# Patient Record
Sex: Female | Born: 1935 | Race: White | Hispanic: No | Smoking: Never smoker
Health system: Southern US, Community
[De-identification: ages and names within clinical notes are randomized; demographics above are authoritative.]

## PROBLEM LIST (undated history)

## (undated) DIAGNOSIS — N200 Calculus of kidney: Secondary | ICD-10-CM

## (undated) HISTORY — PX: HIP SURGERY: SHX245

## (undated) HISTORY — PX: KNEE SURGERY: SHX244

## (undated) HISTORY — DX: Calculus of kidney: N20.0

---

## 1982-05-31 DIAGNOSIS — Z9884 Bariatric surgery status: Secondary | ICD-10-CM

## 1982-05-31 HISTORY — PX: CHOLECYSTECTOMY: SHX55

## 1982-05-31 HISTORY — PX: STOMACH SURGERY: SHX791

## 1982-05-31 HISTORY — DX: Bariatric surgery status: Z98.84

## 2019-07-10 ENCOUNTER — Other Ambulatory Visit: Payer: Self-pay

## 2019-07-10 ENCOUNTER — Ambulatory Visit (INDEPENDENT_AMBULATORY_CARE_PROVIDER_SITE_OTHER): Payer: Medicare Other | Admitting: Cardiology

## 2019-07-10 ENCOUNTER — Encounter: Payer: Self-pay | Admitting: Cardiology

## 2019-07-10 VITALS — BP 145/89 | HR 77 | Ht 70.0 in | Wt 228.0 lb

## 2019-07-10 DIAGNOSIS — I10 Essential (primary) hypertension: Secondary | ICD-10-CM | POA: Diagnosis not present

## 2019-07-10 DIAGNOSIS — I509 Heart failure, unspecified: Secondary | ICD-10-CM | POA: Insufficient documentation

## 2019-07-10 DIAGNOSIS — E088 Diabetes mellitus due to underlying condition with unspecified complications: Secondary | ICD-10-CM

## 2019-07-10 DIAGNOSIS — E119 Type 2 diabetes mellitus without complications: Secondary | ICD-10-CM | POA: Insufficient documentation

## 2019-07-10 DIAGNOSIS — K911 Postgastric surgery syndromes: Secondary | ICD-10-CM

## 2019-07-10 DIAGNOSIS — E1143 Type 2 diabetes mellitus with diabetic autonomic (poly)neuropathy: Secondary | ICD-10-CM

## 2019-07-10 DIAGNOSIS — I5043 Acute on chronic combined systolic (congestive) and diastolic (congestive) heart failure: Secondary | ICD-10-CM | POA: Diagnosis not present

## 2019-07-10 HISTORY — DX: Diabetes mellitus due to underlying condition with unspecified complications: E08.8

## 2019-07-10 HISTORY — DX: Acute on chronic combined systolic (congestive) and diastolic (congestive) heart failure: I50.43

## 2019-07-10 HISTORY — DX: Postgastric surgery syndromes: K91.1

## 2019-07-10 HISTORY — DX: Type 2 diabetes mellitus with diabetic autonomic (poly)neuropathy: E11.43

## 2019-07-10 HISTORY — DX: Heart failure, unspecified: I50.9

## 2019-07-10 HISTORY — DX: Essential (primary) hypertension: I10

## 2019-07-10 NOTE — Progress Notes (Signed)
Cardiology Office Note:    Date:  07/10/2019   ID:  Dana Brown, DOB May 22, 1935, MRN [id number]  PCP:  Buckner Malta, MD  Cardiologist:  Garwin Brothers, MD   Referring MD: Buckner Malta, MD    ASSESSMENT:    1. Essential hypertension   2. Diabetes mellitus due to underlying condition with unspecified complications (HCC)   3. Acute on chronic combined systolic and diastolic CHF (congestive heart failure) (HCC)    PLAN:    In order of problems listed above:  1. Primary prevention stressed with the patient.  Importance of compliance with diet and medication stressed and she vocalized understanding. 2. History of cardiomyopathy: Patient is doing well with her medications.  Again I would like to get an echocardiogram to assess this.  I am awaiting records from her primary care physician and cardiologist to review them and make appropriate recommendations. 3. Essential hypertension: Blood pressure stable 4. Mixed dyslipidemia and diabetes mellitus: Managed by her primary care physician.  Again she had blood work today and I would like to review them were negative. 5. Patient will be seen in follow-up appointment in 3 months or earlier if the patient has any concerns    Medication Adjustments/Labs and Tests Ordered: Current medicines are reviewed at length with the patient today.  Concerns regarding medicines are outlined above.  No orders of the defined types were placed in this encounter.  No orders of the defined types were placed in this encounter.    History of Present Illness:    Dana Brown is a 85 y.o. female who is being seen today for the evaluation of history of congestive heart failure and to be established at the request of Buckner Malta, MD.  Patient is a pleasant 85 year old female.  She has past medical history of essential hypertension diabetes mellitus.  The patient has history of congestive heart failure according to history provided by her.  She  has moved here from [place].  We are awaiting records from her primary care physician and cardiologist.  She has had surgery for her weight loss in the past.  At the time of my evaluation, the patient is alert awake oriented and in no distress.  Past Medical History:  Diagnosis Date  . Acute on chronic combined systolic and diastolic CHF (congestive heart failure) (HCC) 07/10/2019   Swelling in lower extremeties Mild to Moderate Duration: several years  . Diabetes mellitus with peripheral autonomic neuropathy (HCC) 07/10/2019   Mild to moderate. Diet controlled only.  Duration: several years  . 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 Essential hypertension 07/10/2019  . Nephrolithiasis     Past Surgical History:  Procedure Laterality Date  . CHOLECYSTECTOMY  1984  . HIP SURGERY Right    Replacement  . KNEE SURGERY Left    Replacement  . STOMACH SURGERY  1984   Stapled    Current Medications: Current Meds  Medication Sig  . Ascorbic Acid (VITA-C PO) Take by mouth daily.  Marland Kitchen b complex vitamins tablet Take 1 tablet by mouth daily.  Jeanie Cooks Serrata (BOSWELLIA PO) Take by mouth daily.  . Cholecalciferol (VITAMIN D3 PO) Take by mouth daily.  . Coenzyme Q10 (CO Q 10 PO) Take by mouth daily.  Marland Kitchen ECHINACEA EXTRACT PO Take by mouth daily.  . furosemide (LASIX) 20 MG tablet Take 20 mg by mouth 2 (two)  times daily.  . Ginger, Zingiber officinalis, (GINGER PO) Take by mouth daily.  Marland Kitchen GINKGO BILOBA PO Take by mouth daily.  Marland Kitchen MAGNESIUM PO Take by mouth daily.  Marland Kitchen MILK THISTLE PO Take by mouth daily.  . Misc Natural Products (COLON CARE PO) Take by mouth daily.  . Nutritional Supplements (GRAPESEED EXTRACT PO) Take by mouth daily.  . Omega 3-6-9 Fatty Acids (TRIPLE OMEGA-3-6-9 PO) Take by mouth daily.  . potassium chloride (KLOR-CON) 10 MEQ tablet Take 10 mEq by mouth daily.  .  PSYLLIUM HUSK PO Take by mouth daily.  . sacubitril-valsartan (ENTRESTO) 24-26 MG Take 1 tablet by mouth 2 (two) times daily.  Marland Kitchen telmisartan (MICARDIS) 80 MG tablet Take 80 mg by mouth daily.  . Turmeric (QC TUMERIC COMPLEX PO) Take by mouth daily.  Marland Kitchen VITAMIN E PO Take by mouth daily.     Allergies:   Sulfa antibiotics   Social History   Socioeconomic History  . Marital status: Widowed    Spouse name: Not on file  . Number of children: Not on file  . Years of education: Not on file  . Highest education level: Not on file  Occupational History  . Not on file  Tobacco Use  . Smoking status: Never Smoker  . Smokeless tobacco: Never Used  Substance and Sexual Activity  . Alcohol use: Never  . Drug use: Not on file  . Sexual activity: Not on file  Other Topics Concern  . Not on file  Social History Narrative  . Not on file   Social Determinants of Health   Financial Resource Strain:   . Difficulty of Paying Living Expenses: Not on file  Food Insecurity:   . Worried About Charity fundraiser in the Last Year: Not on file  . Ran Out of Food in the Last Year: Not on file  Transportation Needs:   . Lack of Transportation (Medical): Not on file  . Lack of Transportation (Non-Medical): Not on file  Physical Activity:   . Days of Exercise per Week: Not on file  . Minutes of Exercise per Session: Not on file  Stress:   . Feeling of Stress : Not on file  Social Connections:   . Frequency of Communication with Friends and Family: Not on file  . Frequency of Social Gatherings with Friends and Family: Not on file  . Attends Religious Services: Not on file  . Active Member of Clubs or Organizations: Not on file  . Attends Archivist Meetings: Not on file  . Marital Status: Not on file     Family History: The patient's family history is not on file.  ROS:   Please see the history of present illness.    All other systems reviewed and are negative.  EKGs/Labs/Other  Studies Reviewed:    The following studies were reviewed today: EKG reveals sinus rhythm and nonspecific ST-T changes   Recent Labs: No results found for requested labs within last 8760 hours.  Recent Lipid Panel No results found for: CHOL, TRIG, HDL, CHOLHDL, VLDL, LDLCALC, LDLDIRECT  Physical Exam:    VS:  BP (!) 145/89   Pulse 77   Ht 5\' 10"  (1.778 m)   Wt 228 lb (103.4 kg)   SpO2 94%   BMI 32.71 kg/m     Wt Readings from Last 3 Encounters:  07/10/19 228 lb (103.4 kg)     GEN: Patient is in no acute distress HEENT: Normal NECK: No JVD; No  carotid bruits LYMPHATICS: No lymphadenopathy CARDIAC: S1 S2 regular, 2/6 systolic murmur at the apex. RESPIRATORY:  Clear to auscultation without rales, wheezing or rhonchi  ABDOMEN: Soft, non-tender, non-distended MUSCULOSKELETAL:  No edema; No deformity  SKIN: Warm and dry NEUROLOGIC:  Alert and oriented x 3 PSYCHIATRIC:  Normal affect    Signed, Garwin Brothers, MD  07/10/2019 3:25 PM    [facility]

## 2019-07-10 NOTE — Patient Instructions (Addendum)
Medication Instructions:  Your physician recommends that you continue on your current medications as directed. Please refer to the Current Medication list given to you today.  *If you need a refill on your cardiac medications before your next appointment, please call your pharmacy*  Lab Work: NONE If you have labs (blood work) drawn today and your tests are completely normal, you will receive your results only by: Marland Kitchen MyChart Message (if you have MyChart) OR . A paper copy in the mail If you have any lab test that is abnormal or we need to change your treatment, we will call you to review the results.  Testing/Procedures: Your physician has requested that you have an echocardiogram. Echocardiography is a painless test that uses sound waves to create images of your heart. It provides your doctor with information about the size and shape of your heart and how well your heart's chambers and valves are working. This procedure takes approximately one hour. There are no restrictions for this procedure.  Follow-Up: At [facility], you and your health needs are our priority.  As part of our continuing mission to provide you with exceptional heart care, we have created designated Provider Care Teams.  These Care Teams include your primary Cardiologist (physician) and Advanced Practice Providers (APPs -  Physician Assistants and Nurse Practitioners) who all work together to provide you with the care you need, when you need it.  Your next appointment:   3 month(s)  The format for your next appointment:   In Person  Provider:   Belva Crome, MD  Other Instructions   Echocardiogram An echocardiogram is a procedure that uses painless sound waves (ultrasound) to produce an image of the heart. Images from an echocardiogram can provide important information about:  Signs of coronary artery disease (CAD).  Aneurysm detection. An aneurysm is a weak or damaged part of an artery wall that bulges out  from the normal force of blood pumping through the body.  Heart size and shape. Changes in the size or shape of the heart can be associated with certain conditions, including heart failure, aneurysm, and CAD.  Heart muscle function.  Heart valve function.  Signs of a past heart attack.  Fluid buildup around the heart.  Thickening of the heart muscle.  A tumor or infectious growth around the heart valves. Tell a health care provider about:  Any allergies you have.  All medicines you are taking, including vitamins, herbs, eye drops, creams, and over-the-counter medicines.  Any blood disorders you have.  Any surgeries you have had.  Any medical conditions you have.  Whether you are pregnant or may be pregnant. What are the risks? Generally, this is a safe procedure. However, problems may occur, including:  Allergic reaction to dye (contrast) that may be used during the procedure. What happens before the procedure? No specific preparation is needed. You may eat and drink normally. What happens during the procedure?   An IV tube may be inserted into one of your veins.  You may receive contrast through this tube. A contrast is an injection that improves the quality of the pictures from your heart.  A gel will be applied to your chest.  A wand-like tool (transducer) will be moved over your chest. The gel will help to transmit the sound waves from the transducer.  The sound waves will harmlessly bounce off of your heart to allow the heart images to be captured in real-time motion. The images will be recorded on a computer.  The procedure may vary among health care providers and hospitals. What happens after the procedure?  You may return to your normal, everyday life, including diet, activities, and medicines, unless your health care provider tells you not to do that. Summary  An echocardiogram is a procedure that uses painless sound waves (ultrasound) to produce an image  of the heart.  Images from an echocardiogram can provide important information about the size and shape of your heart, heart muscle function, heart valve function, and fluid buildup around your heart.  You do not need to do anything to prepare before this procedure. You may eat and drink normally.  After the echocardiogram is completed, you may return to your normal, everyday life, unless your health care provider tells you not to do that. This information is not intended to replace advice given to you by your health care provider. Make sure you discuss any questions you have with your health care provider. Document Revised: 09/07/2018 Document Reviewed: 06/19/2016 Elsevier Patient Education  [place].

## 2019-07-16 ENCOUNTER — Ambulatory Visit: Payer: Self-pay | Admitting: Podiatry

## 2019-07-23 ENCOUNTER — Other Ambulatory Visit: Payer: Self-pay

## 2019-07-23 ENCOUNTER — Ambulatory Visit (INDEPENDENT_AMBULATORY_CARE_PROVIDER_SITE_OTHER): Payer: Medicare Other | Admitting: Podiatry

## 2019-07-23 DIAGNOSIS — B351 Tinea unguium: Secondary | ICD-10-CM

## 2019-07-23 DIAGNOSIS — E1142 Type 2 diabetes mellitus with diabetic polyneuropathy: Secondary | ICD-10-CM | POA: Diagnosis not present

## 2019-07-23 DIAGNOSIS — E1169 Type 2 diabetes mellitus with other specified complication: Secondary | ICD-10-CM

## 2019-07-23 DIAGNOSIS — E119 Type 2 diabetes mellitus without complications: Secondary | ICD-10-CM | POA: Diagnosis not present

## 2019-07-23 NOTE — Progress Notes (Signed)
  Subjective:  Patient ID: Dana Brown, female    DOB: 1935/05/17,  MRN: [id number]  Chief Complaint  Patient presents with  . debride    diabetiv nail trimming  . Diabetes    FBS: 107 x 1 wk A1C: unkwon PCP: pt does not remember name of Dr.    85 y.o. female presents with the above complaint. History confirmed with patient.   Objective:  Physical Exam: warm, good capillary refill, nail exam onychomycosis of the toenails, no trophic changes or ulcerative lesions. DP pulses palpable, PT pulses palpable, epicritic sensation to light touch intact and protective sensation absent Left Foot: normal exam, no swelling, tenderness, instability; ligaments intact, full range of motion of all ankle/foot joints  Right Foot: normal exam, no swelling, tenderness, instability; ligaments intact, full range of motion of all ankle/foot joints   No images are attached to the encounter.  Assessment:   1. Onychomycosis of multiple toenails with type 2 diabetes mellitus and peripheral neuropathy (HCC)   2. Encounter for diabetic foot exam [facility])    Plan:  Patient was evaluated and treated and all questions answered.  Onychomycosis, Diabetes and DPN -Patient is diabetic with a qualifying condition for at risk foot care. -Educated on DM Footcare.  Procedure: Nail Debridement Rationale: Patient meets criteria for routine foot care due to DPN Type of Debridement: manual, sharp debridement. Instrumentation: Nail nipper, rotary burr. Number of Nails: 10

## 2019-08-28 ENCOUNTER — Ambulatory Visit (INDEPENDENT_AMBULATORY_CARE_PROVIDER_SITE_OTHER): Payer: Medicare Other

## 2019-08-28 ENCOUNTER — Other Ambulatory Visit: Payer: Self-pay

## 2019-08-28 DIAGNOSIS — I5043 Acute on chronic combined systolic (congestive) and diastolic (congestive) heart failure: Secondary | ICD-10-CM

## 2019-08-28 DIAGNOSIS — I77819 Aortic ectasia, unspecified site: Secondary | ICD-10-CM

## 2019-08-28 DIAGNOSIS — E785 Hyperlipidemia, unspecified: Secondary | ICD-10-CM

## 2019-08-28 DIAGNOSIS — I2584 Coronary atherosclerosis due to calcified coronary lesion: Secondary | ICD-10-CM

## 2019-08-28 DIAGNOSIS — I10 Essential (primary) hypertension: Secondary | ICD-10-CM | POA: Diagnosis not present

## 2019-08-28 DIAGNOSIS — I2510 Atherosclerotic heart disease of native coronary artery without angina pectoris: Secondary | ICD-10-CM

## 2019-08-28 NOTE — Progress Notes (Signed)
Complete echocardiogram has been performed.  Jimmy Inetha Maret RDCS, RVT 

## 2019-09-06 ENCOUNTER — Other Ambulatory Visit: Payer: Self-pay

## 2019-09-06 DIAGNOSIS — I10 Essential (primary) hypertension: Secondary | ICD-10-CM

## 2019-09-08 LAB — BASIC METABOLIC PANEL
BUN/Creatinine Ratio: 22 (ref 12–28)
BUN: 21 mg/dL (ref 8–27)
CO2: 24 mmol/L (ref 20–29)
Calcium: 9.8 mg/dL (ref 8.7–10.3)
Chloride: 105 mmol/L (ref 96–106)
Creatinine, Ser: 0.94 mg/dL (ref 0.57–1.00)
GFR calc Af Amer: 65 mL/min/{1.73_m2} (ref >59)
GFR calc non Af Amer: 56 mL/min/{1.73_m2} — ABNORMAL LOW (ref >59)
Glucose: 113 mg/dL — ABNORMAL HIGH (ref 65–99)
Potassium: 4.2 mmol/L (ref 3.5–5.2)
Sodium: 143 mmol/L (ref 134–144)

## 2019-09-12 ENCOUNTER — Other Ambulatory Visit: Payer: Self-pay

## 2019-09-12 ENCOUNTER — Ambulatory Visit (INDEPENDENT_AMBULATORY_CARE_PROVIDER_SITE_OTHER)
Admission: RE | Admit: 2019-09-12 | Discharge: 2019-09-12 | Disposition: A | Discharge status: Home / Self Care | Payer: Medicare Other | Source: Ambulatory Visit | Attending: Cardiology | Admitting: Cardiology

## 2019-09-12 DIAGNOSIS — I77819 Aortic ectasia, unspecified site: Secondary | ICD-10-CM | POA: Diagnosis not present

## 2019-09-12 MED ORDER — IOHEXOL 350 MG/ML SOLN
100.0000 mL | Freq: Once | INTRAVENOUS | Status: AC | PRN
  Administered 2019-09-12: 100 mL via INTRAVENOUS

## 2019-09-18 LAB — LIPID PANEL
Chol/HDL Ratio: 3.5 ratio (ref 0.0–4.4)
Cholesterol, Total: 207 mg/dL — ABNORMAL HIGH (ref 100–199)
HDL: 59 mg/dL (ref >39)
LDL Chol Calc (NIH): 127 mg/dL — ABNORMAL HIGH (ref 0–99)
Triglycerides: 116 mg/dL (ref 0–149)
VLDL Cholesterol Cal: 21 mg/dL (ref 5–40)

## 2019-09-18 LAB — HEPATIC FUNCTION PANEL
ALT: 22 IU/L (ref 0–32)
AST: 27 IU/L (ref 0–40)
Albumin: 4.3 g/dL (ref 3.6–4.6)
Alkaline Phosphatase: 57 IU/L (ref 39–117)
Bilirubin Total: 0.7 mg/dL (ref 0.0–1.2)
Bilirubin, Direct: 0.17 mg/dL (ref 0.00–0.40)
Total Protein: 7.1 g/dL (ref 6.0–8.5)

## 2019-09-20 MED ORDER — ATORVASTATIN CALCIUM 10 MG PO TABS: 10.0000 mg | ORAL_TABLET | Freq: Every day | ORAL | 6 refills | Status: DC

## 2019-10-09 ENCOUNTER — Encounter: Payer: Self-pay | Admitting: Cardiology

## 2019-10-09 ENCOUNTER — Ambulatory Visit (INDEPENDENT_AMBULATORY_CARE_PROVIDER_SITE_OTHER): Payer: Medicare Other | Admitting: Cardiology

## 2019-10-09 ENCOUNTER — Other Ambulatory Visit: Payer: Self-pay

## 2019-10-09 VITALS — BP 150/90 | HR 64 | Ht 70.0 in | Wt 235.0 lb

## 2019-10-09 DIAGNOSIS — I77810 Thoracic aortic ectasia: Secondary | ICD-10-CM | POA: Diagnosis not present

## 2019-10-09 DIAGNOSIS — I5043 Acute on chronic combined systolic (congestive) and diastolic (congestive) heart failure: Secondary | ICD-10-CM | POA: Diagnosis not present

## 2019-10-09 DIAGNOSIS — I10 Essential (primary) hypertension: Secondary | ICD-10-CM | POA: Diagnosis not present

## 2019-10-09 DIAGNOSIS — E088 Diabetes mellitus due to underlying condition with unspecified complications: Secondary | ICD-10-CM

## 2019-10-09 NOTE — Progress Notes (Signed)
Cardiology Office Note:    Date:  10/09/2019   ID:  Dana Brown, DOB Jul 20, 1934, MRN [id number]  PCP:  Buckner Malta, MD  Cardiologist:  Garwin Brothers, MD   Referring MD: Buckner Malta, MD    ASSESSMENT:    1. Acute on chronic combined systolic and diastolic CHF (congestive heart failure) (HCC)   2. Essential hypertension   3. Diabetes mellitus due to underlying condition with unspecified complications (HCC)   4. Ascending aorta dilatation (HCC)    PLAN:    In order of problems listed above:  1. I discussed my findings with the patient at extensive length.  Echocardiogram and CT scan reports were discussed. 2. Coronary calcification: Secondary prevention stressed with patient.  Importance of compliance with diet and medication stressed and he vocalized understanding.  We have initiated her on lipid-lowering medications and she will be back in 6 weeks for liver lipid check.  Importance of regular exercise stressed weight reduction was stressed 3. Essential hypertension: Blood pressure stable she has missed her diuretic today so blood pressure is elevated.  She promises to be compliant. 4. Mixed dyslipidemia: Diet was emphasized and medications to she is very compliant with taking her statin regularly and see me back in 6 weeks for liver lipid check as mentioned above. 5. Obesity: Weight reduction was stressed and secondary prevention stressed Ascending aortic dilatation: I educated her about this.  She will need annual CT scan per recommendations.Patient will be seen in follow-up appointment in 6 months or earlier if the patient has any concerns    Medication Adjustments/Labs and Tests Ordered: Current medicines are reviewed at length with the patient today.  Concerns regarding medicines are outlined above.  No orders of the defined types were placed in this encounter.  No orders of the defined types were placed in this encounter.    Chief Complaint  Patient  presents with  . Follow-up     History of Present Illness:    Dana Brown is a 85 y.o. female.  Patient was evaluated by me for history of congestive heart failure, diabetes mellitus and dyslipidemia.  She has coronary atherosclerosis and calcification noted on CT scan.  She denies any problems at this time and takes care of activities of daily living.  No chest pain orthopnea or PND.  At the time of my evaluation, the patient is alert awake oriented and in no distress.  Past Medical History:  Diagnosis Date  . Acute on chronic combined systolic and diastolic CHF (congestive heart failure) (HCC) 07/10/2019   Swelling in lower extremeties Mild to Moderate Duration: several years  . Congestive heart failure (CHF) (HCC) 07/10/2019  . Diabetes mellitus due to underlying condition with unspecified complications (HCC) 07/10/2019  . Diabetes mellitus with peripheral autonomic neuropathy (HCC) 07/10/2019   Mild to moderate. Diet controlled only.  Duration: several years  . 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 Essential hypertension 07/10/2019  . Nephrolithiasis     Past Surgical History:  Procedure Laterality Date  . CHOLECYSTECTOMY  1984  . HIP SURGERY Right    Replacement  . KNEE SURGERY Left    Replacement  . STOMACH SURGERY  1984   Stapled    Current Medications: Current Meds  Medication Sig  . Ascorbic Acid (VITA-C PO) Take by mouth daily.  Marland Kitchen atorvastatin (LIPITOR) 10 MG tablet Take 1 tablet (10 mg  total) by mouth daily.  Marland Kitchen b complex vitamins tablet Take 1 tablet by mouth daily.  Azucena Freed Serrata (BOSWELLIA PO) Take by mouth daily.  . Cholecalciferol (VITAMIN D3 PO) Take by mouth daily.  . Coenzyme Q10 (CO Q 10 PO) Take by mouth daily.  Marland Kitchen ECHINACEA EXTRACT PO Take by mouth daily.  . furosemide (LASIX) 20 MG tablet Take 20 mg by mouth 2 (two) times daily.  .  Ginger, Zingiber officinalis, (GINGER PO) Take by mouth daily.  Marland Kitchen GINKGO BILOBA PO Take by mouth daily.  Marland Kitchen MAGNESIUM PO Take by mouth daily.  Marland Kitchen MILK THISTLE PO Take by mouth daily.  . Misc Natural Products (COLON CARE PO) Take by mouth daily.  . Nutritional Supplements (GRAPESEED EXTRACT PO) Take by mouth daily.  . Omega 3-6-9 Fatty Acids (TRIPLE OMEGA-3-6-9 PO) Take by mouth daily.  . potassium chloride (KLOR-CON) 10 MEQ tablet Take 10 mEq by mouth daily.  . PSYLLIUM HUSK PO Take by mouth daily.  Marland Kitchen telmisartan (MICARDIS) 80 MG tablet Take 80 mg by mouth daily.  . Turmeric (QC TUMERIC COMPLEX PO) Take by mouth daily.  Marland Kitchen VITAMIN E PO Take by mouth daily.     Allergies:   Sulfa antibiotics   Social History   Socioeconomic History  . Marital status: Widowed    Spouse name: Not on file  . Number of children: Not on file  . Years of education: Not on file  . Highest education level: Not on file  Occupational History  . Not on file  Tobacco Use  . Smoking status: Never Smoker  . Smokeless tobacco: Never Used  Substance and Sexual Activity  . Alcohol use: Never  . Drug use: Not on file  . Sexual activity: Not on file  Other Topics Concern  . Not on file  Social History Narrative  . Not on file   Social Determinants of Health   Financial Resource Strain:   . Difficulty of Paying Living Expenses:   Food Insecurity:   . Worried About Charity fundraiser in the Last Year:   . Arboriculturist in the Last Year:   Transportation Needs:   . Film/video editor (Medical):   Marland Kitchen Lack of Transportation (Non-Medical):   Physical Activity:   . Days of Exercise per Week:   . Minutes of Exercise per Session:   Stress:   . Feeling of Stress :   Social Connections:   . Frequency of Communication with Friends and Family:   . Frequency of Social Gatherings with Friends and Family:   . Attends Religious Services:   . Active Member of Clubs or Organizations:   . Attends Theatre manager Meetings:   Marland Kitchen Marital Status:      Family History: The patient's family history is not on file.  ROS:   Please see the history of present illness.    All other systems reviewed and are negative.  EKGs/Labs/Other Studies Reviewed:    The following studies were reviewed today: IMPRESSION: Dilatation of the ascending aorta with maximal transverse diameter of 4.0 cm no dissection. Recommend annual imaging followup by CTA or MRA. This recommendation follows 2010 ACCF/AHA/AATS/ACR/ASA/SCA/SCAI/SIR/STS/SVM Guidelines for the Diagnosis and Management of Patients with Thoracic Aortic Disease. Circulation. 2010; 121: U202-R427. Aortic aneurysm NOS (ICD10-I71.9)  Coronary artery calcification. Relatively mild scattered thoracic atherosclerotic calcification.   Electronically Signed   By: Nelson Chimes M.D.   On: 09/12/2019 16:19   IMPRESSIONS  1. Left ventricular ejection fraction, by estimation, is 60 to 65%. The  left ventricle has normal function. The left ventricle has no regional  wall motion abnormalities. There is mild left ventricular hypertrophy.  Left ventricular diastolic parameters  are consistent with Grade I diastolic dysfunction (impaired relaxation).  2. Right ventricular systolic function is normal. The right ventricular  size is normal. There is normal pulmonary artery systolic pressure.  3. Left atrial size was mildly dilated.  4. The mitral valve is normal in structure. No evidence of mitral valve  regurgitation. No evidence of mitral stenosis.  5. The aortic valve is normal in structure. Aortic valve regurgitation is  not visualized. Mild aortic valve stenosis.  6. There is moderate dilatation of the ascending aorta measuring 43 mm.  7. The inferior vena cava is normal in size with greater than 50%  respiratory variability, suggesting right atrial pressure of 3 mmHg.     Recent Labs: 09/07/2019: BUN 21; Creatinine, Ser 0.94;  Potassium 4.2; Sodium 143 09/17/2019: ALT 22  Recent Lipid Panel    Component Value Date/Time   CHOL 207 (H) 09/17/2019 1201   TRIG 116 09/17/2019 1201   HDL 59 09/17/2019 1201   CHOLHDL 3.5 09/17/2019 1201   LDLCALC 127 (H) 09/17/2019 1201    Physical Exam:    VS:  BP (!) 150/90   Pulse 64   Ht 5\' 10"  (1.778 m)   Wt 235 lb (106.6 kg)   SpO2 99%   BMI 33.72 kg/m     Wt Readings from Last 3 Encounters:  10/09/19 235 lb (106.6 kg)  07/10/19 228 lb (103.4 kg)     GEN: Patient is in no acute distress HEENT: Normal NECK: No JVD; No carotid bruits LYMPHATICS: No lymphadenopathy CARDIAC: Hear sounds regular, 2/6 systolic murmur at the apex. RESPIRATORY:  Clear to auscultation without rales, wheezing or rhonchi  ABDOMEN: Soft, non-tender, non-distended MUSCULOSKELETAL:  No edema; No deformity  SKIN: Warm and dry NEUROLOGIC:  Alert and oriented x 3 PSYCHIATRIC:  Normal affect   Signed, 09/07/19, MD  10/09/2019 3:45 PM    [facility]

## 2019-10-09 NOTE — Patient Instructions (Signed)
Medication Instructions:  No medication changes *If you need a refill on your cardiac medications before your next appointment, please call your pharmacy*   Lab Work: Your physician recommends that you return for lab work in: next 6 weeks.  You need to have labs done when you are fasting.  You can come Monday through Friday 8:30 am to 12:00 pm and 1:15 to 4:30. You do not need to make an appointment as the order has already been placed. The labs you are going to have done are  LFT and Lipids.   If you have labs (blood work) drawn today and your tests are completely normal, you will receive your results only by: Marland Kitchen MyChart Message (if you have MyChart) OR . A paper copy in the mail If you have any lab test that is abnormal or we need to change your treatment, we will call you to review the results.   Testing/Procedures: None ordered   Follow-Up: At [facility], you and your health needs are our priority.  As part of our continuing mission to provide you with exceptional heart care, we have created designated Provider Care Teams.  These Care Teams include your primary Cardiologist (physician) and Advanced Practice Providers (APPs -  Physician Assistants and Nurse Practitioners) who all work together to provide you with the care you need, when you need it.  We recommend signing up for the patient portal called "MyChart".  Sign up information is provided on this After Visit Summary.  MyChart is used to connect with patients for Virtual Visits (Telemedicine).  Patients are able to view lab/test results, encounter notes, upcoming appointments, etc.  Non-urgent messages can be sent to your provider as well.   To learn more about what you can do with MyChart, go to [email or web address].    Your next appointment:   3 month(s)  The format for your next appointment:   In Person  Provider:   Belva Crome, MD   Other Instructions NA

## 2019-10-23 ENCOUNTER — Ambulatory Visit: Payer: Medicare Other | Admitting: Podiatry

## 2020-01-09 ENCOUNTER — Encounter: Payer: Self-pay | Admitting: Cardiology

## 2020-01-09 ENCOUNTER — Other Ambulatory Visit: Payer: Self-pay

## 2020-01-09 ENCOUNTER — Ambulatory Visit (INDEPENDENT_AMBULATORY_CARE_PROVIDER_SITE_OTHER): Payer: Medicare Other | Admitting: Cardiology

## 2020-01-09 VITALS — BP 156/104 | HR 85 | Ht 69.0 in | Wt 237.6 lb

## 2020-01-09 DIAGNOSIS — I2510 Atherosclerotic heart disease of native coronary artery without angina pectoris: Secondary | ICD-10-CM | POA: Diagnosis not present

## 2020-01-09 DIAGNOSIS — I77810 Thoracic aortic ectasia: Secondary | ICD-10-CM | POA: Diagnosis not present

## 2020-01-09 DIAGNOSIS — E088 Diabetes mellitus due to underlying condition with unspecified complications: Secondary | ICD-10-CM

## 2020-01-09 DIAGNOSIS — I10 Essential (primary) hypertension: Secondary | ICD-10-CM | POA: Diagnosis not present

## 2020-01-09 MED ORDER — ATORVASTATIN CALCIUM 10 MG PO TABS
10.0000 mg | ORAL_TABLET | Freq: Every day | ORAL | 3 refills | Status: DC
Start: 2020-01-09 — End: 2020-07-08

## 2020-01-09 MED ORDER — BLOOD PRESSURE MONITOR AUTOMAT DEVI: 1.0000 | Freq: Two times a day (BID) | 0 refills | Status: AC

## 2020-01-09 NOTE — Patient Instructions (Addendum)
Medication Instructions:  Your physician has recommended you make the following change in your medication:   Start atorvastatin 10 mg daily. ' *If you need a refill on your cardiac medications before your next appointment, please call your pharmacy*   Lab Work: Your physician recommends that you have a LFT's done today. Your physician recommends that you return for lab work in: 6 weeks (02/25/20) You need to have labs done when you are fasting.  You can come Monday through Friday 8:30 am to 12:00 pm and 1:15 to 4:30. You do not need to make an appointment as the order has already been placed. The labs you are going to have done are  LFT and Lipids.   If you have labs (blood work) drawn today and your tests are completely normal, you will receive your results only by: Marland Kitchen MyChart Message (if you have MyChart) OR . A paper copy in the mail If you have any lab test that is abnormal or we need to change your treatment, we will call you to review the results.   Testing/Procedures: None ordered   Follow-Up: At [facility], you and your health needs are our priority.  As part of our continuing mission to provide you with exceptional heart care, we have created designated Provider Care Teams.  These Care Teams include your primary Cardiologist (physician) and Advanced Practice Providers (APPs -  Physician Assistants and Nurse Practitioners) who all work together to provide you with the care you need, when you need it.  We recommend signing up for the patient portal called "MyChart".  Sign up information is provided on this After Visit Summary.  MyChart is used to connect with patients for Virtual Visits (Telemedicine).  Patients are able to view lab/test results, encounter notes, upcoming appointments, etc.  Non-urgent messages can be sent to your provider as well.   To learn more about what you can do with MyChart, go to [email or web address].    Your next appointment:   6 month(s)  The  format for your next appointment:   In Person  Provider:   Belva Crome, MD   Other Instructions Atorvastatin; Ezetimibe oral tablets What is this medicine? ATORVASTATIN; EZETIMIBE (a TORE va sta tin; ez ET i mibe) blocks the body's ability to absorb and make cholesterol. It is used to lower cholesterol. It is only for patients whose cholesterol level is not controlled by diet. This medicine may be used for other purposes; ask your health care provider or pharmacist if you have questions. COMMON BRAND NAME(S): Liptruzet What should I tell my health care provider before I take this medicine? They need to know if you have any of these conditions:  diabetes  history of stroke  if you often drink alcohol  kidney disease  liver disease  muscle aches or weakness  thyroid disease  an unusual or allergic reaction to atorvastatin, ezetimibe, other medicines, foods, dyes, or preservatives  pregnant or trying to get pregnant  breast-feeding How should I use this medicine? Take this medicine by mouth with a glass of water. Follow the directions on the prescription label. Do not cut, crush or chew this medicine. You can take it with or without food. If it upsets your stomach, take it with food. Take your medicine at regular intervals. Do not take it more often than directed. Do not stop taking except on your doctor's advice. Talk to your pediatrician regarding the use of this medicine in children. Special care may be needed. Overdosage: If  you think you have taken too much of this medicine contact a poison control center or emergency room at once. NOTE: This medicine is only for you. Do not share this medicine with others. What if I miss a dose? If you miss a dose, take it as soon as you can. If it is almost time for your next dose, take only that dose. Do not take double or extra doses. What may interact with this medicine? Do not take this medicine with any of the following  medications:  posaconazole  red yeast rice  telithromycin  voriconazole This medicine may also interact with the following medications:  alcohol  antiviral medicines for HIV or AIDS  boceprevir  certain antibiotics like erythromycin and clarithromycin  certain medicines for cholesterol like fenofibrate or gemfibrozil  cimetidine  colchicine  cyclosporine  digoxin  female hormones, like estrogens or progestins and birth control pills  grapefruit juice  medicines for fungal infections like fluconazole, itraconazole, ketoconazole  niacin  rifampin  spironolactone  telaprevir  warfarin This list may not describe all possible interactions. Give your health care provider a list of all the medicines, herbs, non-prescription drugs, or dietary supplements you use. Also tell them if you smoke, drink alcohol, or use illegal drugs. Some items may interact with your medicine. What should I watch for while using this medicine? Visit your doctor or health care professional for regular check-ups. You may need regular tests to make sure your liver is working properly. Tell your doctor or health care professional right away if you get any unexplained muscle pain, tenderness, or weakness, especially if you also have a fever and tiredness. Your doctor or health care professional may tell you to stop taking this medicine if you develop muscle problems. If your muscle problems do not go away after stopping this medicine, contact your health care professional. This medicine may increase blood sugar. Ask your healthcare provider if changes in diet or medicines are needed if you have diabetes. This drug is only part of a total heart-health program. Your doctor or a dietician can suggest a low-cholesterol and low-fat diet to help. Avoid alcohol and smoking, and keep a proper exercise schedule. Do not become pregnant while taking this medicine. Women should inform their doctor if they wish to  become pregnant or think they might be pregnant. There is a potential for serious side effects to an unborn child. Talk to your health care professional or pharmacist for more information. Do not breast-feed an infant while taking this medicine. This medicine may cause a decrease in Co-Enzyme Q-10. You should make sure that you get enough Co-Enzyme Q-10 while you are taking this medicine. Discuss the foods you eat and the vitamins you take with your health care professional. What side effects may I notice from receiving this medicine? Side effects that you should report to your doctor or health care professional as soon as possible:  allergic reactions like skin rash, itching or hives, swelling of the face, lips, or tongue  confusion  dark urine  general ill feeling or flu-like symptoms  light-colored stools  loss of appetite, nausea  loss of memory  muscle pain  redness, blistering, peeling or loosening of the skin, including inside the mouth  right upper belly pain  signs and symptoms of high blood sugar such as being more thirsty or hungry or having to urinate more than normal. You may also feel very tired or have blurry vision.  trouble passing urine  unusually weak  yellowing of the eyes or skin Side effects that usually do not require medical attention (report to your doctor or health care professional if they continue or are bothersome):  cough  diarrhea  dizziness  joint pain This list may not describe all possible side effects. Call your doctor for medical advice about side effects. You may report side effects to FDA at 1-800-FDA-1088. Where should I keep my medicine? Keep out of the reach of children. Store at room temperature between 15 and 30 degrees C (59 and 86 degrees F). Store in the foil pouch until use. After the foil pouch is opened, protect this medicine from moisture and light. Once a tablet is removed, slide blister card back into case. Store the case  in a dry place, and throw away any unused tablets 30 days after the pouch is opened. NOTE: This sheet is a summary. It may not cover all possible information. If you have questions about this medicine, talk to your doctor, pharmacist, or health care provider.  2020 Elsevier/Gold Standard (2018-03-08 11:39:19)   Blood Pressure Record Sheet To take your blood pressure, you will need a blood pressure machine. You can buy a blood pressure machine (blood pressure monitor) at your clinic, drug store, or online. When choosing one, consider:  An automatic monitor that has an arm cuff.  A cuff that wraps snugly around your upper arm. You should be able to fit only one finger between your arm and the cuff.  A device that stores blood pressure reading results.  Do not choose a monitor that measures your blood pressure from your wrist or finger. Follow your health care provider's instructions for how to take your blood pressure. To use this form:  Get one reading in the morning (a.m.) 1-2 hours after you take any medicines.  Get one reading in the evening (p.m.) before supper.  Take at least 2 readings with each blood pressure check. This makes sure the results are correct. Wait 1-2 minutes between measurements.  Write down the results in the spaces on this form.  Repeat this once a week, or as told by your health care provider.  Make a follow-up appointment with your health care provider to discuss the results. Blood pressure log Date: _______________________  a.m. _____________________(1st reading) _____________________(2nd reading)  p.m. _____________________(1st reading) _____________________(2nd reading) Date: _______________________  a.m. _____________________(1st reading) _____________________(2nd reading)  p.m. _____________________(1st reading) _____________________(2nd reading) Date: _______________________  a.m. _____________________(1st reading) _____________________(2nd  reading)  p.m. _____________________(1st reading) _____________________(2nd reading) Date: _______________________  a.m. _____________________(1st reading) _____________________(2nd reading)  p.m. _____________________(1st reading) _____________________(2nd reading) Date: _______________________  a.m. _____________________(1st reading) _____________________(2nd reading)  p.m. _____________________(1st reading) _____________________(2nd reading) This information is not intended to replace advice given to you by your health care provider. Make sure you discuss any questions you have with your health care provider. Document Revised: 07/15/2017 Document Reviewed: 05/17/2017 Elsevier Patient Education  2020 [employer].

## 2020-01-09 NOTE — Progress Notes (Signed)
Cardiology Office Note:    Date:  01/09/2020   ID:  Dana Brown, DOB May 17, 1935, MRN [id number]  PCP:  Buckner Malta, MD  Cardiologist:  Garwin Brothers, MD   Referring MD: Buckner Malta, MD    ASSESSMENT:    1. Ascending aorta dilatation (HCC)   2. Essential hypertension   3. Diabetes mellitus due to underlying condition with unspecified complications (HCC)   4. Atherosclerosis of native coronary artery of native heart without angina pectoris    PLAN:    In order of problems listed above:  1. Coronary atherosclerosis: Detected by CT scanning. Secondary prevention stressed with the patient. Importance of compliance with diet medication stressed and she vocalized understanding. 2. Essential hypertension: Blood pressure is elevated. She mentions to me that it was fine at her primary care physician's office and I have asked her to keep a log of her blood pressures and send it to me in 1 to 2 weeks 3. Mixed dyslipidemia: Patient was started on atorvastatin 10 mg daily but she discontinued it. She does not give any clear reason. We will do LFTs today and start her on atorvastatin 10 mg and she will be back in 6 weeks for liver lipid check. 4. Ascending aortic dilatation: I discussed this with her at length and we will keep a track of it on an annual basis. 5. Patient will be seen in follow-up appointment in 6 months or earlier if the patient has any concerns    Medication Adjustments/Labs and Tests Ordered: Current medicines are reviewed at length with the patient today.  Concerns regarding medicines are outlined above.  No orders of the defined types were placed in this encounter.  No orders of the defined types were placed in this encounter.    No chief complaint on file.    History of Present Illness:    Dana Brown is a 85 y.o. female. Patient has past medical history of coronary atherosclerosis on CT scan. Essential hypertension dyslipidemia and diabetes  mellitus. She is overweight. She denies any problems at this time and takes care of activities of daily living. No chest pain orthopnea or PND. She leads a sedentary lifestyle and is overweight. At the time of my evaluation, the patient is alert awake oriented and in no distress.  Past Medical History:  Diagnosis Date  . Acute on chronic combined systolic and diastolic CHF (congestive heart failure) (HCC) 07/10/2019   Swelling in lower extremeties Mild to Moderate Duration: several years  . Congestive heart failure (CHF) (HCC) 07/10/2019  . Diabetes mellitus due to underlying condition with unspecified complications (HCC) 07/10/2019  . Diabetes mellitus with peripheral autonomic neuropathy (HCC) 07/10/2019   Mild to moderate. Diet controlled only.  Duration: several years  . 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 Essential hypertension 07/10/2019  . Nephrolithiasis     Past Surgical History:  Procedure Laterality Date  . CHOLECYSTECTOMY  1984  . HIP SURGERY Right    Replacement  . KNEE SURGERY Left    Replacement  . STOMACH SURGERY  1984   Stapled    Current Medications: Current Meds  Medication Sig  . Ascorbic Acid (VITA-C PO) Take by mouth daily.  Marland Kitchen b complex vitamins tablet Take 1 tablet by mouth daily.  Jeanie Cooks Serrata (BOSWELLIA PO) Take by mouth daily.  . Cholecalciferol (VITAMIN D3 PO) Take by mouth daily.  Marland Kitchen  Coenzyme Q10 (CO Q 10 PO) Take 10 mEq by mouth daily.   Marland Kitchen ECHINACEA EXTRACT PO Take by mouth daily as needed.   . Fish Oil-Cholecalciferol (OMEGA-3 FISH OIL/VITAMIN D3) 1000-1000 MG-UNIT CAPS Take by mouth.  . furosemide (LASIX) 20 MG tablet Take 20 mg by mouth. 3 times a week  . Ginger, Zingiber officinalis, (GINGER PO) Take by mouth daily.  Marland Kitchen MAGNESIUM PO Take by mouth daily.  Marland Kitchen MILK THISTLE PO Take by mouth daily.  . Misc Natural Products (COLON CARE  PO) Take by mouth daily.  . Nutritional Supplements (GRAPESEED EXTRACT PO) Take by mouth daily.  . Omega 3-6-9 Fatty Acids (TRIPLE OMEGA-3-6-9 PO) Take by mouth daily.  . potassium chloride (KLOR-CON) 10 MEQ tablet Take 10 mEq by mouth 2 (two) times daily.   Marland Kitchen telmisartan (MICARDIS) 80 MG tablet Take 80 mg by mouth daily.  Marland Kitchen VITAMIN E PO Take by mouth daily.     Allergies:   Sacubitril-valsartan and Sulfa antibiotics   Social History   Socioeconomic History  . Marital status: Widowed    Spouse name: Not on file  . Number of children: Not on file  . Years of education: Not on file  . Highest education level: Not on file  Occupational History  . Not on file  Tobacco Use  . Smoking status: Never Smoker  . Smokeless tobacco: Never Used  Substance and Sexual Activity  . Alcohol use: Never  . Drug use: Not on file  . Sexual activity: Not on file  Other Topics Concern  . Not on file  Social History Narrative  . Not on file   Social Determinants of Health   Financial Resource Strain:   . Difficulty of Paying Living Expenses:   Food Insecurity:   . Worried About Programme researcher, broadcasting/film/video in the Last Year:   . Barista in the Last Year:   Transportation Needs:   . Freight forwarder (Medical):   Marland Kitchen Lack of Transportation (Non-Medical):   Physical Activity:   . Days of Exercise per Week:   . Minutes of Exercise per Session:   Stress:   . Feeling of Stress :   Social Connections:   . Frequency of Communication with Friends and Family:   . Frequency of Social Gatherings with Friends and Family:   . Attends Religious Services:   . Active Member of Clubs or Organizations:   . Attends Banker Meetings:   Marland Kitchen Marital Status:      Family History: The patient's family history is not on file.  ROS:   Please see the history of present illness.    All other systems reviewed and are negative.  EKGs/Labs/Other Studies Reviewed:    The following studies were  reviewed today: IMPRESSION: Dilatation of the ascending aorta with maximal transverse diameter of 4.0 cm no dissection. Recommend annual imaging followup by CTA or MRA. This recommendation follows 2010 ACCF/AHA/AATS/ACR/ASA/SCA/SCAI/SIR/STS/SVM Guidelines for the Diagnosis and Management of Patients with Thoracic Aortic Disease. Circulation. 2010; 121: W098-J191. Aortic aneurysm NOS (ICD10-I71.9)  Coronary artery calcification. Relatively mild scattered thoracic atherosclerotic calcification.   Electronically Signed   By: Paulina Fusi M.D.   On: 09/12/2019 16:19  IMPRESSIONS    1. Left ventricular ejection fraction, by estimation, is 60 to 65%. The  left ventricle has normal function. The left ventricle has no regional  wall motion abnormalities. There is mild left ventricular hypertrophy.  Left ventricular diastolic parameters  are consistent  with Grade I diastolic dysfunction (impaired relaxation).  2. Right ventricular systolic function is normal. The right ventricular  size is normal. There is normal pulmonary artery systolic pressure.  3. Left atrial size was mildly dilated.  4. The mitral valve is normal in structure. No evidence of mitral valve  regurgitation. No evidence of mitral stenosis.  5. The aortic valve is normal in structure. Aortic valve regurgitation is  not visualized. Mild aortic valve stenosis.  6. There is moderate dilatation of the ascending aorta measuring 43 mm.  7. The inferior vena cava is normal in size with greater than 50%  respiratory variability, suggesting right atrial pressure of 3 mmHg.    Recent Labs: 09/07/2019: BUN 21; Creatinine, Ser 0.94; Potassium 4.2; Sodium 143 09/17/2019: ALT 22  Recent Lipid Panel    Component Value Date/Time   CHOL 207 (H) 09/17/2019 1201   TRIG 116 09/17/2019 1201   HDL 59 09/17/2019 1201   CHOLHDL 3.5 09/17/2019 1201   LDLCALC 127 (H) 09/17/2019 1201    Physical Exam:    VS:  BP (!) 156/104    Pulse 85   Ht 5\' 9"  (1.753 m)   Wt 237 lb 9.6 oz (107.8 kg)   SpO2 97%   BMI 35.09 kg/m     Wt Readings from Last 3 Encounters:  01/09/20 237 lb 9.6 oz (107.8 kg)  10/09/19 235 lb (106.6 kg)  07/10/19 228 lb (103.4 kg)     GEN: Patient is in no acute distress HEENT: Normal NECK: No JVD; No carotid bruits LYMPHATICS: No lymphadenopathy CARDIAC: Hear sounds regular, 2/6 systolic murmur at the apex. RESPIRATORY:  Clear to auscultation without rales, wheezing or rhonchi  ABDOMEN: Soft, non-tender, non-distended MUSCULOSKELETAL:  No edema; No deformity  SKIN: Warm and dry NEUROLOGIC:  Alert and oriented x 3 PSYCHIATRIC:  Normal affect   Signed, 09/07/19, MD  01/09/2020 3:02 PM    [facility]

## 2020-01-09 NOTE — Addendum Note (Signed)
Addended by: Eleonore Chiquito on: 01/09/2020 03:28 PM   Modules accepted: Orders

## 2020-01-09 NOTE — Addendum Note (Signed)
Addended by: Eleonore Chiquito on: 01/09/2020 05:18 PM   Modules accepted: Orders

## 2020-01-10 LAB — HEPATIC FUNCTION PANEL
ALT: 19 IU/L (ref 0–32)
AST: 23 IU/L (ref 0–40)
Albumin: 4.3 g/dL (ref 3.6–4.6)
Alkaline Phosphatase: 59 IU/L (ref 48–121)
Bilirubin Total: 0.6 mg/dL (ref 0.0–1.2)
Bilirubin, Direct: 0.15 mg/dL (ref 0.00–0.40)
Total Protein: 7.4 g/dL (ref 6.0–8.5)

## 2020-02-20 DIAGNOSIS — E1142 Type 2 diabetes mellitus with diabetic polyneuropathy: Secondary | ICD-10-CM | POA: Insufficient documentation

## 2020-03-04 ENCOUNTER — Ambulatory Visit: Payer: Medicare Other | Admitting: Cardiology

## 2020-03-21 LAB — HEPATIC FUNCTION PANEL
ALT: 17 IU/L (ref 0–32)
AST: 25 IU/L (ref 0–40)
Albumin: 4.1 g/dL (ref 3.6–4.6)
Alkaline Phosphatase: 56 IU/L (ref 44–121)
Bilirubin Total: 0.8 mg/dL (ref 0.0–1.2)
Bilirubin, Direct: 0.24 mg/dL (ref 0.00–0.40)
Total Protein: 7.3 g/dL (ref 6.0–8.5)

## 2020-03-21 LAB — BASIC METABOLIC PANEL
BUN/Creatinine Ratio: 26 (ref 12–28)
BUN: 23 mg/dL (ref 8–27)
CO2: 28 mmol/L (ref 20–29)
Calcium: 9.9 mg/dL (ref 8.7–10.3)
Chloride: 104 mmol/L (ref 96–106)
Creatinine, Ser: 0.9 mg/dL (ref 0.57–1.00)
GFR calc Af Amer: 68 mL/min/{1.73_m2} (ref >59)
GFR calc non Af Amer: 59 mL/min/{1.73_m2} — ABNORMAL LOW (ref >59)
Glucose: 127 mg/dL — ABNORMAL HIGH (ref 65–99)
Potassium: 4.3 mmol/L (ref 3.5–5.2)
Sodium: 143 mmol/L (ref 134–144)

## 2020-03-21 LAB — LIPID PANEL
Chol/HDL Ratio: 2.6 ratio (ref 0.0–4.4)
Cholesterol, Total: 153 mg/dL (ref 100–199)
HDL: 60 mg/dL (ref >39)
LDL Chol Calc (NIH): 74 mg/dL (ref 0–99)
Triglycerides: 108 mg/dL (ref 0–149)
VLDL Cholesterol Cal: 19 mg/dL (ref 5–40)

## 2020-03-25 ENCOUNTER — Encounter: Payer: Self-pay | Admitting: Cardiology

## 2020-03-25 ENCOUNTER — Other Ambulatory Visit: Payer: Self-pay

## 2020-03-25 ENCOUNTER — Telehealth: Payer: Self-pay

## 2020-03-25 ENCOUNTER — Ambulatory Visit (INDEPENDENT_AMBULATORY_CARE_PROVIDER_SITE_OTHER): Payer: Medicare Other | Admitting: Cardiology

## 2020-03-25 VITALS — BP 142/84 | HR 66 | Ht 70.0 in | Wt 240.4 lb

## 2020-03-25 DIAGNOSIS — E782 Mixed hyperlipidemia: Secondary | ICD-10-CM

## 2020-03-25 DIAGNOSIS — I10 Essential (primary) hypertension: Secondary | ICD-10-CM | POA: Diagnosis not present

## 2020-03-25 DIAGNOSIS — E669 Obesity, unspecified: Secondary | ICD-10-CM

## 2020-03-25 DIAGNOSIS — I5032 Chronic diastolic (congestive) heart failure: Secondary | ICD-10-CM | POA: Insufficient documentation

## 2020-03-25 NOTE — Telephone Encounter (Signed)
-----   Message from Garwin Brothers, MD sent at 03/25/2020  8:42 AM EDT ----- The results of the study is unremarkable. Please inform patient. I will discuss in detail at next appointment. Cc  primary care/referring physician Garwin Brothers, MD 03/25/2020 8:42 AM

## 2020-03-25 NOTE — Telephone Encounter (Signed)
Patient is returning call.  °

## 2020-03-25 NOTE — Progress Notes (Signed)
Cardiology Office Note:    Date:  03/25/2020   ID:  Dana Brown, DOB [birth year], MRN [id number]  PCP:  Serita Grammes, MD  Cardiologist:  No primary care provider on file.  Electrophysiologist:  None   Referring MD: Serita Grammes, MD   " I am doing well. I am here to discuss my blood work"  History of Present Illness:    Dana Brown is a 85 y.o. female with a hx of chronic diastolic heart failure recent EF 60 to 15%, diastolic dysfunction, hyperlipidemia, diabetes mellitus, and obesity presents today for follow-up visit.  The patient last saw Dr. Geraldo Pitter on Oct 09, 2019.  At that time they discussed her echo results as well as well as a CT chest which showed ascending aortic dilatation.  She was started on lipid-lowering medication and she came back recently to get her follow-up blood work.  And she is here today to discuss this.   Past Medical History:  Diagnosis Date  . Acute on chronic combined systolic and diastolic CHF (congestive heart failure) ([place]) 07/10/2019   Swelling in lower extremeties Mild to Moderate Duration: several years  . Congestive heart failure (CHF) ([place]) 07/10/2019  . Diabetes mellitus due to underlying condition with unspecified complications ([place]) 0/09/6977  . Diabetes mellitus with peripheral autonomic neuropathy ([place]) 07/10/2019   Mild to moderate. Diet controlled only.  Duration: several years  . 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 Essential hypertension 07/10/2019  . Nephrolithiasis     Past Surgical History:  Procedure Laterality Date  . CHOLECYSTECTOMY  1984  . HIP SURGERY Right    Replacement  . KNEE SURGERY Left    Replacement  . STOMACH SURGERY  1984   Stapled    Current Medications: Current Meds  Medication Sig  . Ascorbic Acid (VITA-C PO) Take by mouth daily.  Marland Kitchen aspirin EC 81 MG tablet Take 81 mg by mouth  daily. Swallow whole.  Marland Kitchen atorvastatin (LIPITOR) 10 MG tablet Take 1 tablet (10 mg total) by mouth daily.  Marland Kitchen b complex vitamins tablet Take 1 tablet by mouth daily.  . Blood Pressure Monitoring (BLOOD PRESSURE MONITOR AUTOMAT) DEVI 1 kit by Does not apply route in the morning and at bedtime.  Azucena Freed Serrata (BOSWELLIA PO) Take by mouth daily.  . Cholecalciferol (VITAMIN D3 PO) Take by mouth daily.  . Coenzyme Q10 (CO Q 10 PO) Take 10 mEq by mouth daily.   Marland Kitchen ECHINACEA EXTRACT PO Take by mouth daily as needed.   . Fish Oil-Cholecalciferol (OMEGA-3 FISH OIL/VITAMIN D3) 1000-1000 MG-UNIT CAPS Take by mouth.  . furosemide (LASIX) 20 MG tablet Take 20 mg by mouth. 3 times a week  . Ginger, Zingiber officinalis, (GINGER PO) Take by mouth daily.  Marland Kitchen MAGNESIUM PO Take by mouth daily.  Marland Kitchen MILK THISTLE PO Take by mouth daily.  . Misc Natural Products (COLON CARE PO) Take by mouth daily.  . Nutritional Supplements (GRAPESEED EXTRACT PO) Take by mouth daily.  . Omega 3-6-9 Fatty Acids (TRIPLE OMEGA-3-6-9 PO) Take by mouth daily.  . potassium chloride (KLOR-CON) 10 MEQ tablet Take 10 mEq by mouth 2 (two) times daily.   Marland Kitchen telmisartan (MICARDIS) 80 MG tablet Take 80 mg by mouth daily.  Marland Kitchen VITAMIN E PO Take by mouth daily.     Allergies:   Sacubitril-valsartan and Sulfa antibiotics   Social History  Socioeconomic History  . Marital status: Widowed    Spouse name: Not on file  . Number of children: Not on file  . Years of education: Not on file  . Highest education level: Not on file  Occupational History  . Not on file  Tobacco Use  . Smoking status: Never Smoker  . Smokeless tobacco: Never Used  Substance and Sexual Activity  . Alcohol use: Never  . Drug use: Not on file  . Sexual activity: Not on file  Other Topics Concern  . Not on file  Social History Narrative  . Not on file   Social Determinants of Health   Financial Resource Strain:   . Difficulty of Paying Living Expenses:  Not on file  Food Insecurity:   . Worried About Charity fundraiser in the Last Year: Not on file  . Ran Out of Food in the Last Year: Not on file  Transportation Needs:   . Lack of Transportation (Medical): Not on file  . Lack of Transportation (Non-Medical): Not on file  Physical Activity:   . Days of Exercise per Week: Not on file  . Minutes of Exercise per Session: Not on file  Stress:   . Feeling of Stress : Not on file  Social Connections:   . Frequency of Communication with Friends and Family: Not on file  . Frequency of Social Gatherings with Friends and Family: Not on file  . Attends Religious Services: Not on file  . Active Member of Clubs or Organizations: Not on file  . Attends Archivist Meetings: Not on file  . Marital Status: Not on file     Family History: The patient's family history is not on file.  ROS:   Review of Systems  Constitution: Negative for decreased appetite, fever and weight gain.  HENT: Negative for congestion, ear discharge, hoarse voice and sore throat.   Eyes: Negative for discharge, redness, vision loss in right eye and visual halos.  Cardiovascular: Negative for chest pain, dyspnea on exertion, leg swelling, orthopnea and palpitations.  Respiratory: Negative for cough, hemoptysis, shortness of breath and snoring.   Endocrine: Negative for heat intolerance and polyphagia.  Hematologic/Lymphatic: Negative for bleeding problem. Does not bruise/bleed easily.  Skin: Negative for flushing, nail changes, rash and suspicious lesions.  Musculoskeletal: Negative for arthritis, joint pain, muscle cramps, myalgias, neck pain and stiffness.  Gastrointestinal: Negative for abdominal pain, bowel incontinence, diarrhea and excessive appetite.  Genitourinary: Negative for decreased libido, genital sores and incomplete emptying.  Neurological: Negative for brief paralysis, focal weakness, headaches and loss of balance.  Psychiatric/Behavioral:  Negative for altered mental status, depression and suicidal ideas.  Allergic/Immunologic: Negative for HIV exposure and persistent infections.    EKGs/Labs/Other Studies Reviewed:    The following studies were reviewed today:   EKG: None today.  Transthoracic echocardiogram IMPRESSIONS  1. Left ventricular ejection fraction, by estimation, is 60 to 65%. The left ventricle has normal function. The left ventricle has no regional  wall motion abnormalities. There is mild left ventricular hypertrophy.  Left ventricular diastolic parameters  are consistent with Grade I diastolic dysfunction (impaired relaxation).  2. Right ventricular systolic function is normal. The right ventricular  size is normal. There is normal pulmonary artery systolic pressure.  3. Left atrial size was mildly dilated.  4. The mitral valve is normal in structure. No evidence of mitral valve  regurgitation. No evidence of mitral stenosis.  5. The aortic valve is normal in structure. Aortic  valve regurgitation is  not visualized. Mild aortic valve stenosis.  6. There is moderate dilatation of the ascending aorta measuring 43 mm.  7. The inferior vena cava is normal in size with greater than 50%  respiratory variability, suggesting right atrial pressure of 3   Chest CTA impression Dilatation of the ascending aorta with maximal transverse diameter of 4.0 cm no dissection. Recommend annual imaging followup by CTA or MRA. This recommendation follows 2010 ACCF/AHA/AATS/ACR/ASA/SCA/SCAI/SIR/STS/SVM Guidelines for the Diagnosis and Management of Patients with Thoracic Aortic Disease. Circulation. 2010; 121: P809-X833. Aortic aneurysm NOS (ICD10-I71.9)  Coronary artery calcification. Relatively mild scattered thoracic atherosclerotic calcification.  Recent Labs: 03/21/2020: ALT 17; BUN 23; Creatinine, Ser 0.90; Potassium 4.3; Sodium 143  Recent Lipid Panel    Component Value Date/Time   CHOL 153  03/21/2020 1019   TRIG 108 03/21/2020 1019   HDL 60 03/21/2020 1019   CHOLHDL 2.6 03/21/2020 1019   LDLCALC 74 03/21/2020 1019    Physical Exam:    VS:  BP (!) 142/84   Pulse 66   Ht _0  (1.778 m)   Wt 240 lb 6.4 oz (109 kg)   SpO2 97%   BMI 34.49 kg/m     Wt Readings from Last 3 Encounters:  03/25/20 240 lb 6.4 oz (109 kg)  01/09/20 237 lb 9.6 oz (107.8 kg)  10/09/19 235 lb (106.6 kg)     GEN: Well nourished, well developed in no acute distress HEENT: Normal NECK: No JVD; No carotid bruits LYMPHATICS: No lymphadenopathy CARDIAC: S1S2 noted,RRR, no murmurs, rubs, gallops RESPIRATORY:  Clear to auscultation without rales, wheezing or rhonchi  ABDOMEN: Soft, non-tender, non-distended, +bowel sounds, no guarding. EXTREMITIES: No edema, No cyanosis, no clubbing MUSCULOSKELETAL:  No deformity  SKIN: Warm and dry NEUROLOGIC:  Alert and oriented x 3, non-focal PSYCHIATRIC:  Normal affect, good insight  ASSESSMENT:    1. Essential hypertension   2. Chronic diastolic heart failure ([place])   3. Mixed hyperlipidemia   4. Obesity (BMI 30-39.9)    PLAN:    She did miss her diuretics pills because she tells me she was coming to the office so her blood pressure is elevated I would not like to change her medication regimen as someone avoid any hypotension in this patient. She has no angina symptoms. In terms of her lipid profile I have reviewed this with the patient and has improved. She will remain on her lipitor 10 mg daily.   The patient is in agreement with the above plan. The patient left the office in stable condition.  The patient will follow up in 12 months with Dr. Julianne Rice.   Medication Adjustments/Labs and Tests Ordered: Current medicines are reviewed at length with the patient today.  Concerns regarding medicines are outlined above.  No orders of the defined types were placed in this encounter.  No orders of the defined types were placed in this  encounter.   Patient Instructions  Medication Instructions:  Your physician recommends that you continue on your current medications as directed. Please refer to the Current Medication list given to you today.  *If you need a refill on your cardiac medications before your next appointment, please call your pharmacy*   Lab Work: None If you have labs (blood work) drawn today and your tests are completely normal, you will receive your results only by: Marland Kitchen MyChart Message (if you have MyChart) OR . A paper copy in the mail If you have any lab test that is abnormal  or we need to change your treatment, we will call you to review the results.   Testing/Procedures: None   Follow-Up: At [facility], you and your health needs are our priority.  As part of our continuing mission to provide you with exceptional heart care, we have created designated Provider Care Teams.  These Care Teams include your primary Cardiologist (physician) and Advanced Practice Providers (APPs -  Physician Assistants and Nurse Practitioners) who all work together to provide you with the care you need, when you need it.  We recommend signing up for the patient portal called "MyChart".  Sign up information is provided on this After Visit Summary.  MyChart is used to connect with patients for Virtual Visits (Telemedicine).  Patients are able to view lab/test results, encounter notes, upcoming appointments, etc.  Non-urgent messages can be sent to your provider as well.   To learn more about what you can do with MyChart, go to [email or web address].    Your next appointment:   1 year(s)  The format for your next appointment:   In Person  Provider:   Berniece Salines, DO   Other Instructions      Adopting a Healthy Lifestyle.  Know what a healthy weight is for you (roughly BMI <25) and aim to maintain this   Aim for 7+ servings of fruits and vegetables daily   65-80+ fluid ounces of water or unsweet tea for  healthy kidneys   Limit to max 1 drink of alcohol per day; avoid smoking/tobacco   Limit animal fats in diet for cholesterol and heart health - choose grass fed whenever available   Avoid highly processed foods, and foods high in saturated/trans fats   Aim for low stress - take time to unwind and care for your mental health   Aim for 150 min of moderate intensity exercise weekly for heart health, and weights twice weekly for bone health   Aim for 7-9 hours of sleep daily   When it comes to diets, agreement about the perfect plan isnt easy to find, even among the experts. Experts at the [employer] developed an idea known as the Healthy Eating Plate. Just imagine a plate divided into logical, healthy portions.   The emphasis is on diet quality:   Load up on vegetables and fruits - one-half of your plate: Aim for color and variety, and remember that potatoes dont count.   Go for whole grains - one-quarter of your plate: Whole wheat, barley, wheat berries, quinoa, oats, brown rice, and foods made with them. If you want pasta, go with whole wheat pasta.   Protein power - one-quarter of your plate: Fish, chicken, beans, and nuts are all healthy, versatile protein sources. Limit red meat.   The diet, however, does go beyond the plate, offering a few other suggestions.   Use healthy plant oils, such as olive, canola, soy, corn, sunflower and peanut. Check the labels, and avoid partially hydrogenated oil, which have unhealthy trans fats.   If youre thirsty, drink water. Coffee and tea are good in moderation, but skip sugary drinks and limit milk and dairy products to one or two daily servings.   The type of carbohydrate in the diet is more important than the amount. Some sources of carbohydrates, such as vegetables, fruits, whole grains, and beans-are healthier than others.   Finally, stay active  Signed, Berniece Salines, DO  03/25/2020 8:23 PM    [place]

## 2020-03-25 NOTE — Patient Instructions (Signed)

## 2020-03-25 NOTE — Telephone Encounter (Signed)
Spoke with patient regarding results and recommendation.  Patient verbalizes understanding and is agreeable to plan of care. Advised patient to call back with any issues or concerns.  

## 2020-03-25 NOTE — Telephone Encounter (Signed)
Left message on patients voicemail to please return our call.   

## 2020-06-23 ENCOUNTER — Telehealth: Payer: Self-pay | Admitting: Cardiology

## 2020-06-23 NOTE — Telephone Encounter (Signed)
Called patient she reports she has a hematoma on her leg. She wants to know if she can hold her aspirin until her leg gets better. Will consult with Dr. Tomie China.

## 2020-06-23 NOTE — Telephone Encounter (Signed)
Pt c/o medication issue:  1. Name of Medication: aspirin EC 81 MG tablet    2. How are you currently taking this medication (dosage and times per day)? As prescribed.  3. Are you having a reaction (difficulty breathing--STAT)? No.  4. What is your medication issue? Patient states that she has a hematoma on her leg and states that she doesn't think she should be taking this medication. She would like to speak to Dr.Revankar's nurse about this matter. Please advise.

## 2020-06-23 NOTE — Telephone Encounter (Signed)
Yes she can but she needs to have her hematoma assessed by her primary care doctor if necessary

## 2020-06-23 NOTE — Telephone Encounter (Signed)
Called patient informed her per Dr. Tomie China she can hold aspirin until leg is better. She has already seen pcp for this.

## 2020-06-24 ENCOUNTER — Encounter: Payer: Self-pay | Admitting: Nurse Practitioner

## 2020-07-07 ENCOUNTER — Other Ambulatory Visit: Payer: Self-pay

## 2020-07-08 ENCOUNTER — Encounter: Payer: Self-pay | Admitting: Nurse Practitioner

## 2020-07-08 ENCOUNTER — Ambulatory Visit (INDEPENDENT_AMBULATORY_CARE_PROVIDER_SITE_OTHER): Payer: Medicare Other | Admitting: Nurse Practitioner

## 2020-07-08 VITALS — BP 180/110 | HR 74 | Ht 70.0 in | Wt 238.0 lb

## 2020-07-08 DIAGNOSIS — K648 Other hemorrhoids: Secondary | ICD-10-CM

## 2020-07-08 DIAGNOSIS — K625 Hemorrhage of anus and rectum: Secondary | ICD-10-CM | POA: Diagnosis not present

## 2020-07-08 DIAGNOSIS — K529 Noninfective gastroenteritis and colitis, unspecified: Secondary | ICD-10-CM | POA: Diagnosis not present

## 2020-07-08 MED ORDER — HYDROCORTISONE (PERIANAL) 2.5 % EX CREA: 1.0000 "application " | TOPICAL_CREAM | Freq: Two times a day (BID) | CUTANEOUS | 1 refills | Status: AC

## 2020-07-08 NOTE — Progress Notes (Signed)
Reviewed and agree with management plan.  Quinnten Calvin T. Jayleah Garbers, MD FACG [phone]  

## 2020-07-08 NOTE — Patient Instructions (Addendum)
If you are age 84 or older, your body mass index should be between 23-30. Your Body mass index is 34.15 kg/m. If this is out of the aforementioned range listed, please consider follow up with your Primary Care Provider.  If you are age 57 or younger, your body mass index should be between 19-25. Your Body mass index is 34.15 kg/m. If this is out of the aformentioned range listed, please consider follow up with your Primary Care Provider.   We have sent the following medications to your pharmacy for you to pick up at your convenience: Hydrocortisone cream Twice daily inside rectum for ten days.  Increase fiber to once daily.  Thank you for choosing me and [facility].  Willette Cluster, NP

## 2020-07-08 NOTE — Progress Notes (Addendum)
ASSESSMENT AND PLAN    # 86 yo female with intermittent rectal bleeding with BM. External hemorrhoids and large swollen internal hemorrhoids on anoscopy today. Hgb 12 at PCP's office a few days ago. Suspect bleeding is hemorrhoidal.  --Anusol cream BID x 10 days --Will request last colonoscopy report from [place].  --Follow up in a few weeks. Hopefully we can avoid colonoscopy given advanced age and co-morbidities but it will depend on clinical course and findings on last colonoscopy.  # Remote history of gastric stapling  # LUQ pain. Seems musculoskeletal. Present for years and occurs only with bending or twisting. Recent labs were unremarkable. No weight loss ( gained) or other alarm features.     # HTN, BP is 180 /110. She took BP meds today. Patient says her BP is never this high.    # Ascending aortic dilatation ( 4 cm) on CT scan April 2021)  # Chronic combined CHF   ADDENDUM 07/23/20 Received records from [facility].   Colonoscopy by Gifford Shave, MD on 02/26/2013 -Complete exam, good colon prep.  Small anal fissure,  terminal ileum normal.  Redundant and tortuous colon, small internal hemorrhoids.  EGD 02/26/2013 by Dr. Gifford Shave --Small hiatal hernia small opening above and below the site of the gastric band consistent with dehiscence of the suture line.  Previously the band had been seen eroding through but not seen today so likely passed at some point in time.  Normal duodenum  Colonoscopy by Dr. Gifford Shave July 2009 --Complete exam, good prep --Small internal hemorrhoids, exam otherwise normal  EGD by Dr. Oneida Alar July 2009 --Normal esophagus.  Evidence of prior vertical band gastrostomy that was partially intact.  The mesh partially eroded through the wall but still attached to the mucosa.  Normal duodenum  Colonoscopy 2003 by Dr. Gifford Shave October 2003 --Complete exam, good prep --Redundant and tortuous colon --Terminal  ileum endoscopically normal --Moderate internal hemorrhoids  HISTORY OF PRESENT ILLNESS     Primary Gastroenterologist : new Lucio Edward, MD  Chief Complaint : rectal bleeding and abdominal pain.   Dana Brown is a 86 y.o. female with PMH / [place] significant for,  but not necessarily limited to: HTN, DM, coronary atherosclerosis, hyperlipidemia, ascending aortic dilatation ( 4 cm) , chronic combined CHF, obesity, cholecystectomy, gastric stapling  Patient referred by PCP for abdominal pain and rectal bleeding. She had gastric stapling in 1984. For the last 20 years she has an urgent BM consisting of very watery fluid after each meal. .However, if she takes Fiber a few times a week this will generally produce a soft BM but doesn't help with the urgency.  She sees blood in stool 1-2 times a month but thinks it is hemorrhoid related.  Her last colonoscopy was ~ 5 years in [place] ( Dr. Gifford Shave)   Patient has LUQ pain sometimes,  but only when bending or twisting.  Pain especially noticeable if she bends down to feed her cat. She is concerned about loose gastric staples causing the pain when she moves.   No nausea or vomiting. No weight loss, she has gained weight.  She has no GERD symptoms.    Data Reviewed: March 2021 Echo by Dr. Geraldo Pitter - EF 60-65%   Oct 2021 CMP - ok  Pcp office 04/30/20 Hgb 12, mcv 88 A1c 7.1 CMET normal.    Previous Endoscopic Evaluations / Pertinent Studies:  Previous colonoscopy done in  [place]   Past Medical History:  Diagnosis Date  . Acute on chronic combined systolic and diastolic CHF (congestive heart failure) ([place]) 07/10/2019   Swelling in lower extremeties Mild to Moderate Duration: several years  . Congestive heart failure (CHF) ([place]) 07/10/2019  . Diabetes mellitus due to underlying condition with unspecified complications ([place]) 06/05/1094  . Diabetes mellitus with peripheral autonomic neuropathy ([place]) 07/10/2019   Mild to moderate. Diet  controlled only.  Duration: several years  . 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 Essential hypertension 07/10/2019  . Nephrolithiasis      Past Surgical History:  Procedure Laterality Date  . CHOLECYSTECTOMY  1984  . HIP SURGERY Right    Replacement  . KNEE SURGERY Left    Replacement  . STOMACH SURGERY  1984   Stapled   No family history on file. Social History   Tobacco Use  . Smoking status: Never Smoker  . Smokeless tobacco: Never Used  Substance Use Topics  . Alcohol use: Never   Current Outpatient Medications  Medication Sig Dispense Refill  . Ascorbic Acid (VITAMIN C) 100 MG tablet Take 100 mg by mouth daily.    Marland Kitchen aspirin EC 81 MG tablet Take 81 mg by mouth daily. Swallow whole.    Marland Kitchen atorvastatin (LIPITOR) 10 MG tablet Take 1 tablet (10 mg total) by mouth daily. 90 tablet 3  . b complex vitamins tablet Take 1 tablet by mouth daily.    . Blood Pressure Monitoring (BLOOD PRESSURE MONITOR AUTOMAT) DEVI 1 kit by Does not apply route in the morning and at bedtime. 1 each 0  . Boswellia Serrata (BOSWELLIA PO) Take by mouth daily.    . Cholecalciferol (VITAMIN D3 PO) Take by mouth daily.    . Coenzyme Q10 (CO Q 10 PO) Take 10 mEq by mouth daily.     . Echinacea 125 MG CAPS Take by mouth.    Dana Brown EXTRACT PO Take by mouth daily as needed.     . Fish Oil-Cholecalciferol (OMEGA-3 FISH OIL/VITAMIN D3) 1000-1000 MG-UNIT CAPS Take by mouth.    . furosemide (LASIX) 20 MG tablet Take 20 mg by mouth. 3 times a week    . Ginger, Zingiber officinalis, (GINGER PO) Take by mouth daily.    . Ginkgo Biloba 100 MG CAPS Take by mouth.    Marland Kitchen MAGNESIUM PO Take by mouth daily.    Marland Kitchen MILK THISTLE PO Take by mouth daily.    . Misc Natural Products (COLON CARE PO) Take by mouth daily.    . Nutritional Supplements (GRAPESEED EXTRACT PO) Take by mouth daily.    . Nutritional  Supplements (GRAPESEED EXTRACT PO) Take by mouth.    . Omega 3-6-9 Fatty Acids (TRIPLE OMEGA-3-6-9 PO) Take by mouth daily.    . potassium chloride (KLOR-CON) 10 MEQ tablet Take 10 mEq by mouth 2 (two) times daily.     . PSYLLIUM HUSK PO Take by mouth.    . telmisartan (MICARDIS) 80 MG tablet Take 80 mg by mouth daily.    Marland Kitchen VITAMIN E PO Take by mouth daily.    . Zinc Sulfate (ZINC 15 PO) Take by mouth.     No current facility-administered medications for this visit.   Allergies  Allergen Reactions  . Sacubitril-Valsartan Other (See Comments)    Ask  . Sulfa Antibiotics Hives     Review of  Systems: All systems reviewed and negative except where noted in HPI.   PHYSICAL EXAM :    Wt Readings from Last 3 Encounters:  07/08/20 238 lb (108 kg)  03/25/20 240 lb 6.4 oz (109 kg)  01/09/20 237 lb 9.6 oz (107.8 kg)    BP (!) 180/110   Pulse 74   Ht 5' 10"  (1.778 m)   Wt 238 lb (108 kg)   BMI 34.15 kg/m  Constitutional:  Pleasant female in no acute distress. Psychiatric: Normal mood and affect. Behavior is normal. EENT: Pupils normal.  Conjunctivae are normal. No scleral icterus. Neck supple.  Cardiovascular: Normal rate, regular rhythm. No edema Pulmonary/chest: Effort normal and breath sounds normal. No wheezing, rales or rhonchi. Abdominal: Soft, nondistended, nontender. Bowel sounds active throughout. There are no masses palpable. No hepatomegaly. Rectal: External hemorrhoids. On anoscopy there are large, swollen internal hemorrhoids.  Neurological: Alert and oriented to person place and time. Skin: Skin is warm and dry. No rashes noted.  Tye Savoy, NP  07/08/2020, 1:39 PM  Cc:  Referring Provider Serita Grammes, MD

## 2020-07-11 ENCOUNTER — Telehealth: Payer: Self-pay | Admitting: Cardiology

## 2020-07-11 ENCOUNTER — Ambulatory Visit: Payer: Medicare Other | Admitting: Cardiology

## 2020-07-11 NOTE — Telephone Encounter (Signed)
Advised to talk with PCP for recommendation.

## 2020-07-11 NOTE — Telephone Encounter (Signed)
° ° ° °  Pt's daughter would like to ask Dr. Tomie China if pt is ok using foot revitalizer

## 2020-08-14 ENCOUNTER — Ambulatory Visit: Payer: Medicare Other | Admitting: Nurse Practitioner

## 2020-08-21 ENCOUNTER — Other Ambulatory Visit (INDEPENDENT_AMBULATORY_CARE_PROVIDER_SITE_OTHER): Payer: Medicare Other

## 2020-08-21 ENCOUNTER — Encounter: Payer: Self-pay | Admitting: Nurse Practitioner

## 2020-08-21 ENCOUNTER — Ambulatory Visit (INDEPENDENT_AMBULATORY_CARE_PROVIDER_SITE_OTHER): Payer: Medicare Other | Admitting: Nurse Practitioner

## 2020-08-21 VITALS — BP 164/90 | HR 65 | Ht 69.0 in | Wt 234.0 lb

## 2020-08-21 DIAGNOSIS — K529 Noninfective gastroenteritis and colitis, unspecified: Secondary | ICD-10-CM

## 2020-08-21 DIAGNOSIS — R1012 Left upper quadrant pain: Secondary | ICD-10-CM

## 2020-08-21 LAB — LIPASE: Lipase: 24 U/L (ref 11.0–59.0)

## 2020-08-21 LAB — CBC
HCT: 38.6 % (ref 36.0–46.0)
Hemoglobin: 12.8 g/dL (ref 12.0–15.0)
MCHC: 33.2 g/dL (ref 30.0–36.0)
MCV: 89.2 fl (ref 78.0–100.0)
Platelets: 223 10*3/uL (ref 150.0–400.0)
RBC: 4.33 Mil/uL (ref 3.87–5.11)
RDW: 14.7 % (ref 11.5–15.5)
WBC: 6.7 10*3/uL (ref 4.0–10.5)

## 2020-08-21 MED ORDER — OMEPRAZOLE 40 MG PO CPDR: 40.0000 mg | DELAYED_RELEASE_CAPSULE | Freq: Every morning | ORAL | 3 refills | Status: DC

## 2020-08-21 MED ORDER — COLESTIPOL HCL 1 G PO TABS: 1.0000 g | ORAL_TABLET | Freq: Two times a day (BID) | ORAL | 2 refills | Status: DC

## 2020-08-21 NOTE — Patient Instructions (Addendum)
If you are age 84 or older, your body mass index should be between 23-30. Your Body mass index is 34.56 kg/m. If this is out of the aforementioned range listed, please consider follow up with your Primary Care Provider.  If you are age 71 or younger, your body mass index should be between 19-25. Your Body mass index is 34.56 kg/m. If this is out of the aformentioned range listed, please consider follow up with your Primary Care Provider.   Your provider has requested that you go to the basement level for lab work before leaving today. Press "B" on the elevator. The lab is located at the first door on the left as you exit the elevator.  STOP the Following medications: Fiber supplement Magnesium supplement  START: Colestipol 1 gram 1 tablet by mouth two times daily. Omeprazole 40 mg 1 capsule every morning.  You have been scheduled to follow up with Willette Cluster, NP-C on April 27,2022 at 11:30 am.  Thank you for entrusting me with your care and choosing [facility].  Willette Cluster, NP-C

## 2020-08-21 NOTE — Progress Notes (Signed)
ASSESSMENT AND PLAN    # Painless rectal bleeding, resolved after hemorrhoid treatment .  Suspect bleeding was hemorrhoidal.  She had a complete colonoscopy with a good prep in 2014.  Only internal hemorrhoids were found.  Patient inquiring about having a colonoscopy.  --Given advanced age I think it is preferable to avoid invasive work-up.  However, if she has recurrent bleeding and wants colonoscopy then we can proceed --I think the chronic loose stool may be aggravating her hemorrhoids so will address that  # Chronic loose stool, at least for 15 years.  Seems to have started post cholecystectomy.  She could have bile acid diarrhea --Trial of Colestid BID --Recommend she stop Mg+ supplement --Stop fiber, she has bloating and it does not seem to be really helping her loose stool anyway   # LUQ pain.  At previous visit patient told me the pain was present only with bending and twisting, now she says it occurs at other times as well.  She is worried about a tumor in her stomach.  She would like an upper endoscopy . No weight loss, no nausea or vomiting.  I tried to reassure her. --Obtain lipase, CBC. Liver chemistries were normal. In October 2021 --Trial of omeprazole 40 mg q am.  --If labs are unremarkable and left upper quadrant does not respond to PPI then can proceed with EGD.  We will discuss further at follow-up visit   HISTORY OF PRESENT ILLNESS    Chief Complaint : Follow-up on rectal bleeding  Dana Brown is a 86 y.o. female known to Dr. Fuller Plan with a past medical history of gastric bypass for morbid obesity in 1984, hypertension,ascending aortic dilatation ( 4 cm) on CT scan April 2021, chronic combined CHF  07/08/2020 - established care here for evaluation of rectal bleeding.  Office visit for intermittent rectal bleeding, hemorrhoids found on exam.  Hemoglobin was stable at 12.  Bleeding felt to be hemorrhoidal, treated with Anusol cream.  Patient also complained of LUQ  pain which was present for years and occurred only with bending or twisting.  Pain was felt to be musculoskeletal. Records from [place] [facility] were requested.   INTERVAL HISTORY: Records received from previous GI, see endoscopic evaluation below   Her hemorrhoids are no longer bothering her since completing course of Anusol cream.  No further bleeding except for a minor amount on one occasion since her last visit.   Patient has had postprandial diarrhea since her remote cholecystectomy .She recently discontinued her fiber supplement , felt it was causing bloating and gas.  She stopped eating cabbage which has helped. She inquires about having a colonoscopy to evaluate the loose stool which has been present for nearly 15 years    Patient is also concerned about intermittent LUQ pain over the last couple of years. .At previous last visit she implied the pain was present only when bending or twisting . She now says the pain occurs at other times during the day, unrelated to movement.  She is worried about a tumor in her stomach.   No nausea / vomiting or weight loss. She doesn't take NSAIDs.    PREVIOUS ENDOSCOPIC EVALUATIONS / PERTINENT STUDIES:   [facility].  Colonoscopy by Gifford Shave, MD on 02/26/2013 -Complete exam, good colon prep.  Small anal fissure,  terminal ileum normal.  Redundant and tortuous colon, small internal hemorrhoids.  EGD 02/26/2013 by Dr. Gifford Shave --Small hiatal hernia small opening above  and below the site of the gastric band consistent with dehiscence of the suture line.  Previously the band had been seen eroding through but not seen today so likely passed at some point in time.  Normal duodenum  Colonoscopy by Dr. Gifford Shave July 2009 --Complete exam, good prep --Small internal hemorrhoids, exam otherwise normal  EGD by Dr. Oneida Alar July 2009 --Normal esophagus.  Evidence of prior vertical band gastrostomy that  was partially intact.  The mesh partially eroded through the wall but still attached to the mucosa.  Normal duodenum  Colonoscopy 2003 by Dr. Gifford Shave October 2003 --Complete exam, good prep --Redundant and tortuous colon --Terminal ileum endoscopically normal --Moderate internal hemorrhoids   Past Medical History:  Diagnosis Date   Acute on chronic combined systolic and diastolic CHF (congestive heart failure) ([place]) 07/10/2019   Swelling in lower extremeties Mild to Moderate Duration: several years   Congestive heart failure (CHF) ([place]) 07/10/2019   Diabetes mellitus due to underlying condition with unspecified complications ([place]) 0/05/7508   Diabetes mellitus with peripheral autonomic neuropathy ([place]) 07/10/2019   Mild to moderate. Diet controlled only.  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   Current Outpatient Medications  Medication Sig Dispense Refill   Ascorbic Acid (VITAMIN C) 100 MG tablet Take 100 mg by mouth daily.     aspirin EC 81 MG tablet Take 81 mg by mouth daily. Swallow whole.     b complex vitamins tablet Take 1 tablet by mouth daily.     Blood Pressure Monitoring (BLOOD PRESSURE MONITOR AUTOMAT) DEVI 1 kit by Does not apply route in the morning and at bedtime. 1 each 0   Boswellia Serrata (BOSWELLIA PO) Take by mouth as needed.     Cholecalciferol (VITAMIN D3 PO) Take by mouth daily.     Coenzyme Q10 (CO Q 10 PO) Take 10 mEq by mouth daily. Some days     ECHINACEA EXTRACT PO Take by mouth daily as needed.      Fish Oil-Cholecalciferol (OMEGA-3 FISH  OIL/VITAMIN D3) 1000-1000 MG-UNIT CAPS Take by mouth. Some days     furosemide (LASIX) 20 MG tablet Take 20 mg by mouth. 3 times a week     Ginger, Zingiber officinalis, (GINGER PO) Take by mouth daily.     hydrocortisone (ANUSOL-HC) 2.5 % rectal cream Place 1 application rectally 2 (two) times daily. Apply inside rectum 2 times daily 30 g 1   MAGNESIUM PO Take by mouth daily.     MILK THISTLE PO Take by mouth daily.     Nutritional Supplements (GRAPESEED EXTRACT PO) Take by mouth.     potassium chloride (KLOR-CON) 10 MEQ tablet Take 10 mEq by mouth daily.     telmisartan (MICARDIS) 80 MG tablet Take 80 mg by mouth daily.     VITAMIN E PO Take by mouth daily.     Zinc Sulfate (ZINC 15 PO) Take by mouth. Some days     No current facility-administered medications for this visit.    Review of Systems: No chest pain. No shortness of breath. No urinary complaints.   PHYSICAL EXAM :  Wt Readings from Last 3 Encounters:  08/21/20 234 lb (106.1 kg)  07/08/20 238 lb (108 kg)  03/25/20 240 lb 6.4 oz (109 kg)    BP (!) 164/90    Pulse 65    Ht 5' 9"  (1.753 m)    Wt 234 lb (106.1 kg)    SpO2 98%    BMI 34.56 kg/m  Constitutional:  Pleasant female in no acute distress. Psychiatric: Normal mood and affect. Behavior is normal. EENT: Pupils normal.  Conjunctivae are normal. No scleral icterus. Neck supple.  Cardiovascular: Normal rate, regular rhythm. No edema Pulmonary/chest: Effort normal and breath sounds normal. No wheezing, rales or rhonchi. Abdominal: Soft, nondistended, nontender. Bowel sounds active throughout. There are no masses palpable. No hepatomegaly. Neurological: Alert and oriented to person place and time. Skin: Skin is warm and dry. No rashes noted.  Tye Savoy, NP  08/21/2020, 11:51 AM  Cc:   Serita Grammes, MD

## 2020-08-22 ENCOUNTER — Encounter: Payer: Self-pay | Admitting: Nurse Practitioner

## 2020-08-25 NOTE — Progress Notes (Signed)
Reviewed and agree with management plan.  Dannielle Baskins T. Coleby Yett, MD FACG [phone]  

## 2020-09-24 ENCOUNTER — Ambulatory Visit (INDEPENDENT_AMBULATORY_CARE_PROVIDER_SITE_OTHER): Payer: Medicare Other | Admitting: Nurse Practitioner

## 2020-09-24 ENCOUNTER — Other Ambulatory Visit: Payer: Self-pay

## 2020-09-24 ENCOUNTER — Encounter: Payer: Self-pay | Admitting: Nurse Practitioner

## 2020-09-24 VITALS — BP 130/90 | HR 76 | Ht 69.0 in | Wt 235.0 lb

## 2020-09-24 DIAGNOSIS — R1012 Left upper quadrant pain: Secondary | ICD-10-CM | POA: Diagnosis not present

## 2020-09-24 DIAGNOSIS — K529 Noninfective gastroenteritis and colitis, unspecified: Secondary | ICD-10-CM | POA: Diagnosis not present

## 2020-09-24 MED ORDER — COLESTIPOL HCL 1 G PO TABS: 2.0000 g | ORAL_TABLET | Freq: Two times a day (BID) | ORAL | 3 refills | Status: AC

## 2020-09-24 NOTE — Patient Instructions (Signed)
We have sent the following medications to your pharmacy for you to pick up at your convenience:  Colestid  Stop your PPI  Call if symptoms recur

## 2020-09-24 NOTE — Progress Notes (Signed)
Reviewed and agree with management plan.  Marni Franzoni T. Cortlynn Hollinsworth, MD FACG [phone]  

## 2020-09-24 NOTE — Progress Notes (Signed)
ASSESSMENT AND PLAN     # 86 yo female with chronic loose stool, at least for 15 years worth and seems to have started post cholecystectomy.  Recently started her on  colestid for possible bile salt related diarrhea.  She has had significant improvement in stool consistency and frequency .  However, stools are still not formed, she inquires about increasing the dose of Colestid --I think it is reasonable to increase Colestid.  Will increase dose to 2 mg twice daily.  I did tell her to watch for signs of constipation including hard stool or diminished frequency . She will let us know if this happens. Otherwise she will follow up prn ---Last colonoscopy was done in 2-014. Complete exam, good colon prep.  Small anal fissure,  terminal ileum normal.  Redundant and tortuous colon, small internal hemorrhoids.   # Chronic LUQ pain ( years).  Pain mainly associated with movement such as bending and twisting.  --lipase, CBC, liver chemistries normal.  No associated nausea, vomiting or weight loss.  --She was given a trial of omeprazole at last visit but has not noticed any change in symptoms so will discontinue it.  -- Patient will let us know if she has worsening of the LUQ pain or she develops any symptoms such as nausea, vomiting or weight loss   HISTORY OF PRESENT ILLNESS    Chief Complaint : follow up on LUQ pain, loose stool and left sided abdominal pain  Dana Brown is a 86 y.o. female known to Dr. Fuller Plan  with a past medical history of HTN, DM, coronary atherosclerosis, hyperlipidemia, ascending aortic dilatation ( 4 cm) , chronic combined CHF, obesity, cholecystectomy, gastric stapling  Feb 2022 = Patient established care here early February of this year for evaluation of rectal bleeding, chronic loose urgent bowel movements and chronic LUQ pain.  LUQ pain felt to be musculoskeletal. She had internal hemorrhoids on exam which we treated with Anusol cream.    March 2022 -  returned  for follow-up late March and reported that rectal bleeding had resolved.  Her chronic loose stool had been ongoing for many years and possibly started post cholecystectomy so she was given a trial of Colestid.  She complained of ongoing LUQ pain which at the previous visit was described as mainly positional but now patient worried about a tumor in her stomach.  She had no associated weight loss, nausea or vomiting.  Obtained lipase, CBC, and liver chemistries ( returned normal).  The pain seemed mainly positional but she was given a trial of omeprazole.   INTERVAL HISTORY: Patient is back with her daughter. Patient says she feels like a new person after starting Colestid. She is able to leave home without having diarrhea. Stools have improved from watery to "pudding" consistency and she the frequency has also improved.  She has continued Mg+ for cramps. She still has not had any further rectal bleeding since using the Anusol cream.   Patient still has occasional LUQ discomfort, often related to position but sometimes related to what she eats. She gets similar pain in RUQ at times. She had the LUQ discomfort when stepping up on exam table and positioning  hersefl. Her lipase and CBC were normal.  No associated nausea, vomiting or weight loss.  No significant bloating  / gas. She doesn't really feel like the addition of omeprazole changed anything. She has been eating less carbohydrates lately   PREVIOUS ENDOSCOPIC EVALUATIONS / PERTINENT STUDIES:  Colonoscopy by Gifford Shave, MD on 02/26/2013 -Complete exam, good colon prep.  Small anal fissure,  terminal ileum normal.  Redundant and tortuous colon, small internal hemorrhoids.  EGD 02/26/2013 by Dr. Gifford Shave --Small hiatal hernia small opening above and below the site of the gastric band consistent with dehiscence of the suture line.  Previously the band had been seen eroding through but not seen today so likely passed at some point in time.   Normal duodenum  Colonoscopy by Dr. Gifford Shave July 2009 --Complete exam, good prep --Small internal hemorrhoids, exam otherwise normal  EGD by Dr. Oneida Alar July 2009 --Normal esophagus.  Evidence of prior vertical band gastrostomy that was partially intact.  The mesh partially eroded through the wall but still attached to the mucosa.  Normal duodenum  Colonoscopy 2003 by Dr. Gifford Shave October 2003 --Complete exam, good prep --Redundant and tortuous colon --Terminal ileum endoscopically normal --Moderate internal hemorrhoids   Past Medical History:  Diagnosis Date  . Acute on chronic combined systolic and diastolic CHF (congestive heart failure) ([place]) 07/10/2019   Swelling in lower extremeties Mild to Moderate Duration: several years  . Congestive heart failure (CHF) ([place]) 07/10/2019  . Diabetes mellitus due to underlying condition with unspecified complications ([place]) 10/01/2990  . Diabetes mellitus with peripheral autonomic neuropathy ([employer]) 07/10/2019   Mild to moderate. Diet controlled only.  Duration: several years  . 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 Essential hypertension 07/10/2019  . Hx of bariatric surgery 1984   stomach stapled  . Nephrolithiasis     Current Medications, Allergies, Past Surgical History, Family History and Social History were reviewed in [employer] record.   Current Outpatient Medications  Medication Sig Dispense Refill  . Ascorbic Acid (VITAMIN C) 100 MG tablet Take 100 mg by mouth daily.    Marland Kitchen aspirin EC 81 MG tablet Take 81 mg by mouth daily. Swallow whole.    . b complex vitamins tablet Take 1 tablet by mouth daily.    . Blood Pressure Monitoring (BLOOD PRESSURE MONITOR AUTOMAT) DEVI 1 kit by Does not apply route in the morning and at bedtime. 1 each 0  . Boswellia Serrata (BOSWELLIA PO) Take by mouth as  needed.    . Cholecalciferol (VITAMIN D3 PO) Take by mouth daily.    . Coenzyme Q10 (CO Q 10 PO) Take 10 mEq by mouth daily. Some days    . colestipol (COLESTID) 1 g tablet Take 1 tablet (1 g total) by mouth 2 (two) times daily. 60 tablet 2  . ECHINACEA EXTRACT PO Take by mouth daily as needed.     . Fish Oil-Cholecalciferol (OMEGA-3 FISH OIL/VITAMIN D3) 1000-1000 MG-UNIT CAPS Take by mouth. Some days    . furosemide (LASIX) 20 MG tablet Take 20 mg by mouth. 3 times a week    . Ginger, Zingiber officinalis, (GINGER PO) Take by mouth daily.    . hydrocortisone (ANUSOL-HC) 2.5 % rectal cream Place 1 application rectally 2 (two) times daily. Apply inside rectum 2 times daily 30 g 1  . lisinopril (ZESTRIL) 5 MG tablet Take 5 mg by mouth daily.    Marland Kitchen MAGNESIUM PO Take by mouth daily.    Marland Kitchen MILK THISTLE PO Take by mouth daily.    . Nutritional Supplements (GRAPESEED EXTRACT PO) Take by mouth.    Marland Kitchen omeprazole (PRILOSEC) 40 MG capsule Take  1 capsule (40 mg total) by mouth in the morning. 30 capsule 3  . potassium chloride (KLOR-CON) 10 MEQ tablet Take 10 mEq by mouth daily.    Marland Kitchen telmisartan (MICARDIS) 80 MG tablet Take 80 mg by mouth daily.    Marland Kitchen VITAMIN E PO Take by mouth daily.    . Zinc Sulfate (ZINC 15 PO) Take by mouth. Some days     No current facility-administered medications for this visit.    Review of Systems: No chest pain. No shortness of breath. No urinary complaints.   PHYSICAL EXAM :    Wt Readings from Last 3 Encounters:  09/24/20 235 lb (106.6 kg)  08/21/20 234 lb (106.1 kg)  07/08/20 238 lb (108 kg)    BP 130/90   Pulse 76   Ht 5' 9"  (1.753 m)   Wt 235 lb (106.6 kg)   SpO2 94%   BMI 34.70 kg/m  Constitutional:  Pleasant female in no acute distress. Psychiatric: Normal mood and affect. Behavior is normal. EENT: Pupils normal.  Conjunctivae are normal. No scleral icterus. Neck supple.  Cardiovascular: Normal rate, regular rhythm. No edema Pulmonary/chest: Effort  normal and breath sounds normal. No wheezing, rales or rhonchi. Abdominal: Soft, nondistended, nontender, ? Small , upper abdominal wall hernia ( just left of midline). Bowel sounds active throughout. There are no masses palpable.  Neurological: Alert and oriented to person place and time. Skin: Skin is warm and dry. No rashes noted.  I spent 30 minutes total reviewing records, obtaining history, performing exam, counseling patient and documenting visit / findings.    Tye Savoy, NP  09/24/2020, 12:01 PM

## 2022-03-14 IMAGING — CT CT ANGIO CHEST
3 of 8 series · 18 of 46 positions shown · IV contrast (OMNIPAQUE 350)
Comparison: None.

CLINICAL DATA: Aortic disease.  Dilatation of the aorta on ECHO.

EXAM:
CT ANGIOGRAPHY CHEST WITH CONTRAST
TECHNIQUE: Multidetector CT imaging of the chest was performed using the
standard protocol during bolus administration of intravenous
contrast. Multiplanar CT image reconstructions and MIPs were
obtained to evaluate the vascular anatomy.
CONTRAST:  100mL OMNIPAQUE IOHEXOL 350 MG/ML SOLN

[Series 4: aorta 3.0 bf37 2 · axial · 0.74mm/px · z∈[-264,-0]mm · 13 of 104 slices shown]
[im 8/104  lung]
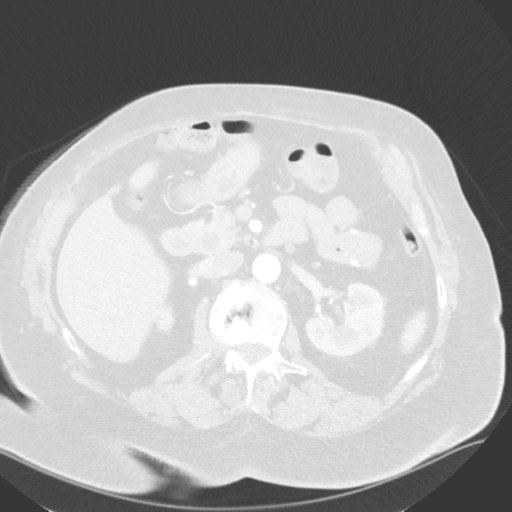
[im 15/104  soft-tissue]
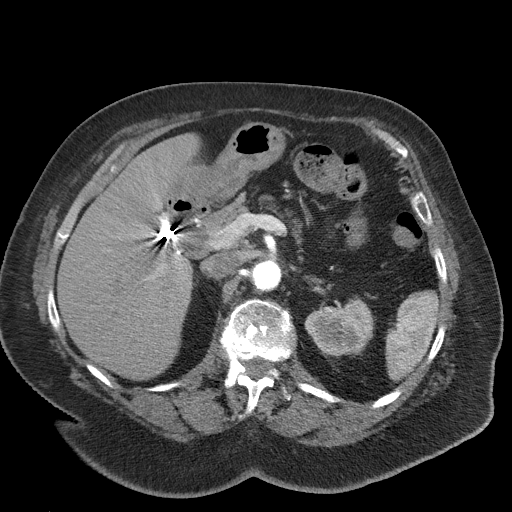
[im 23/104  lung]
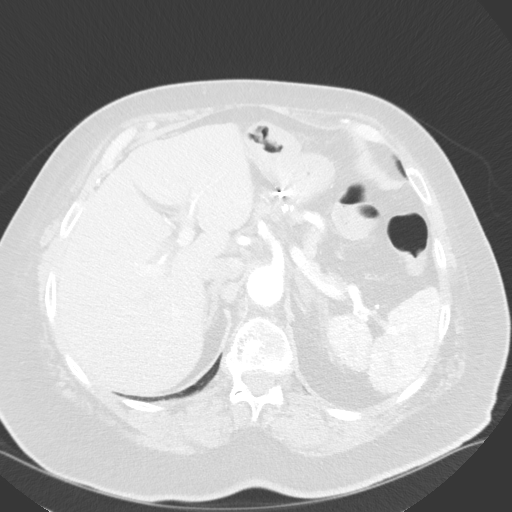
[im 30/104  soft-tissue]
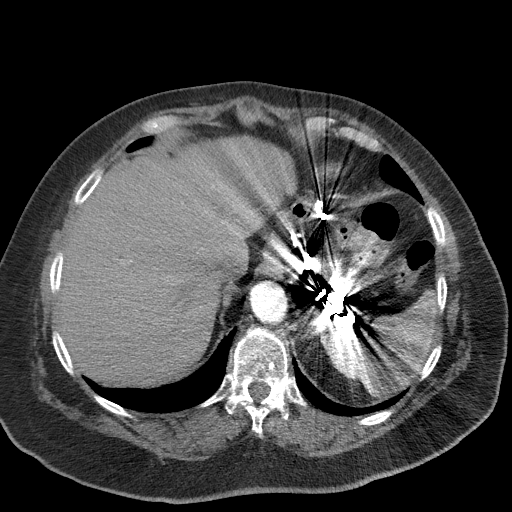
[im 37/104  lung]
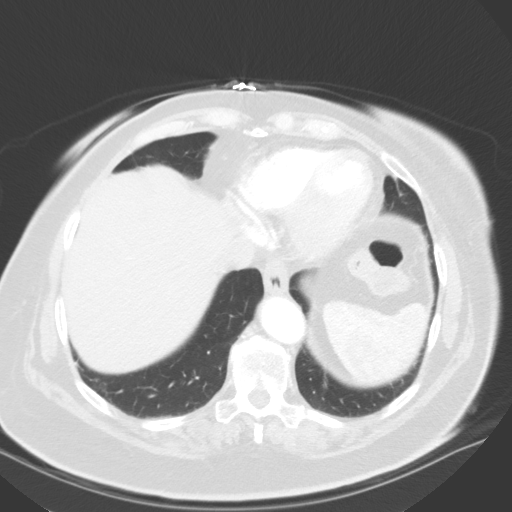
[im 45/104  soft-tissue]
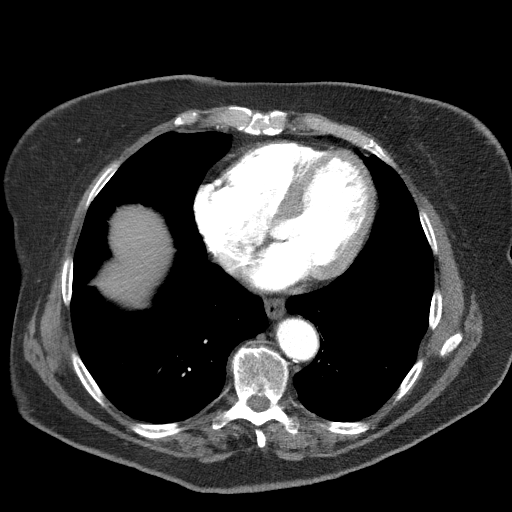
[im 52/104  lung]
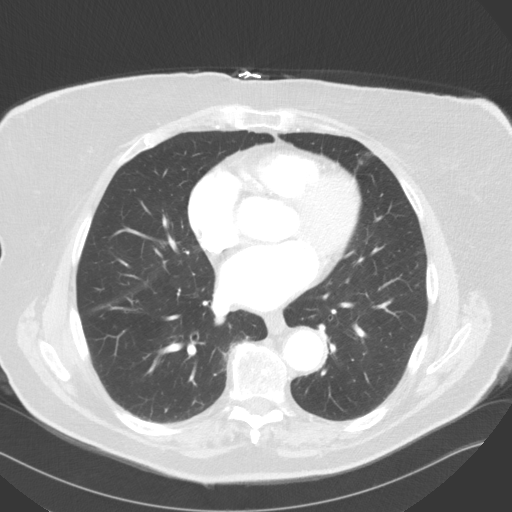
[im 59/104  soft-tissue]
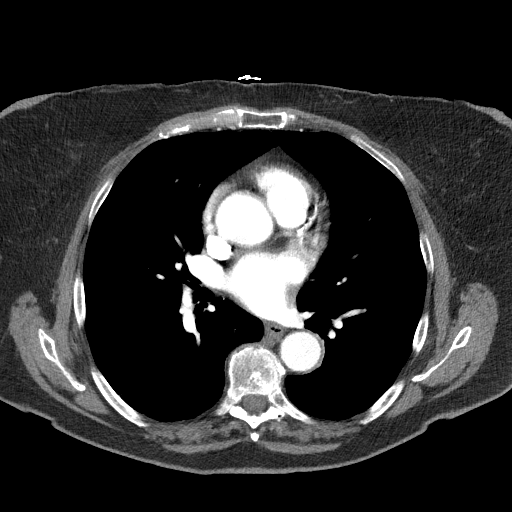
[im 67/104  lung]
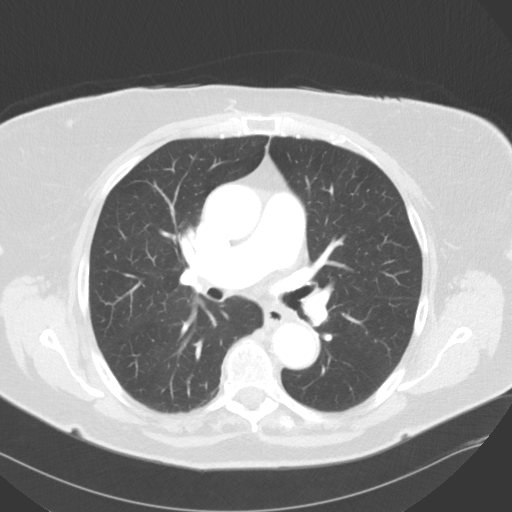
[im 74/104  soft-tissue]
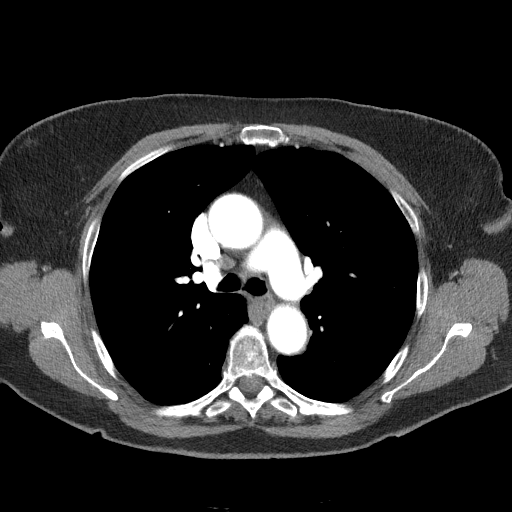
[im 81/104  lung]
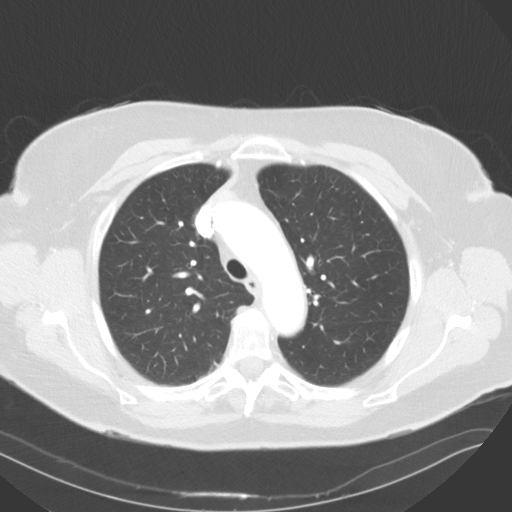
[im 89/104  soft-tissue]
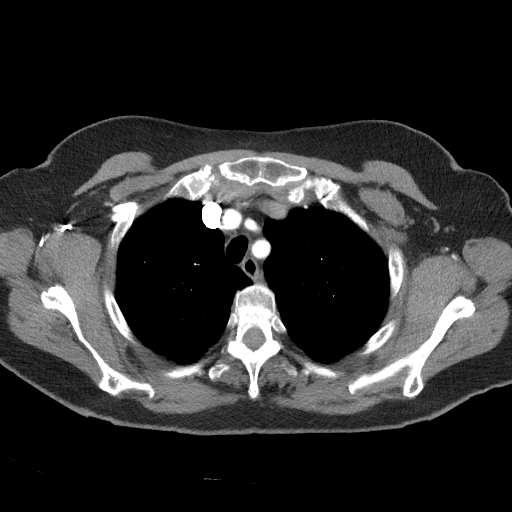
[im 96/104  lung]
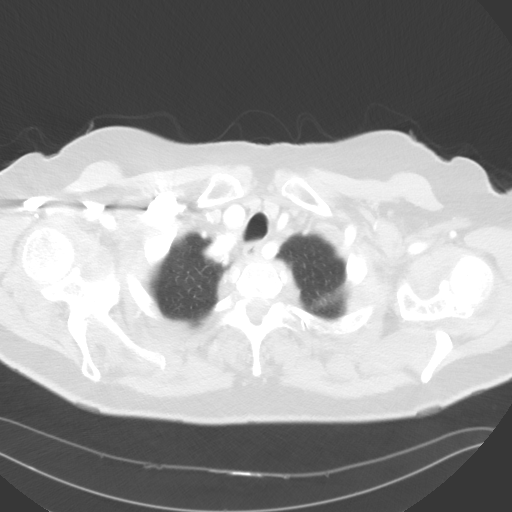

[Series 5: lung · axial · 0.74mm/px · z∈[-264,-220]mm · 2 of 104 slices shown]
[im 8/104  soft-tissue]
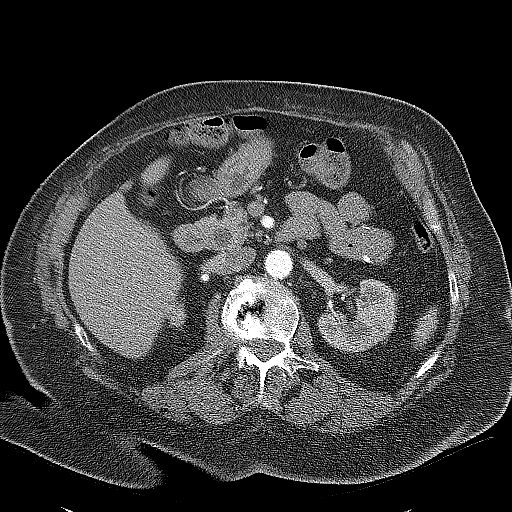
[im 23/104  soft-tissue]
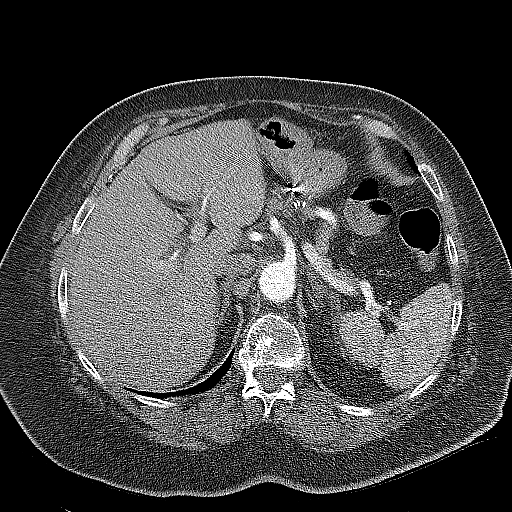

[Series 7: coronals · coronal · 0.64mm/px · 3 of 151 slices shown]
[im 38/151  soft-tissue]
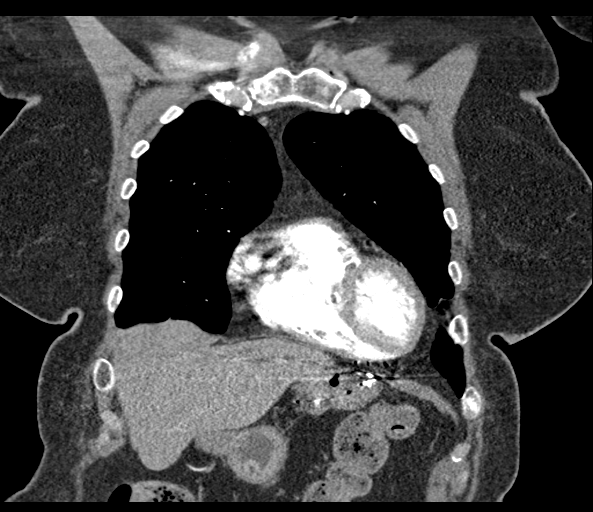
[im 76/151  soft-tissue]
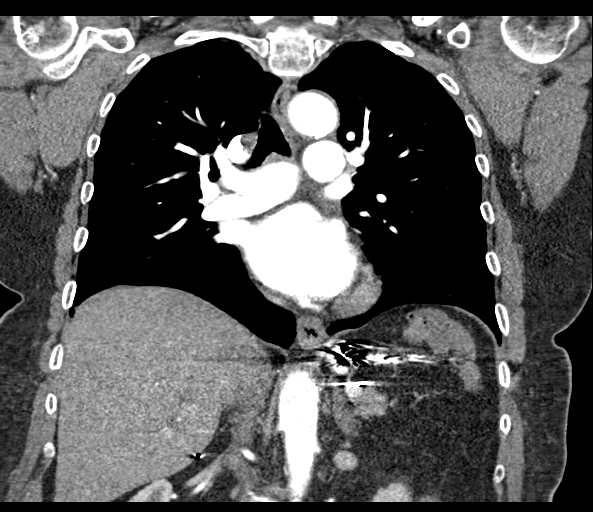
[im 113/151  soft-tissue]
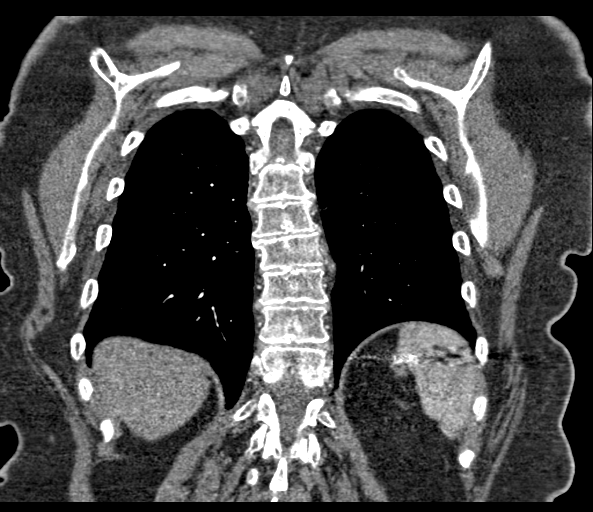

[18 of 46 positions shown; findings below may reference images not displayed]

FINDINGS: Cardiovascular: Heart size is normal. Coronary artery calcification
is noted. The ascending aorta is dilated with maximal transverse
diameter of 4 cm. No evidence of dissection. Relatively mild
atherosclerotic calcification is present scattered within the
thoracic aorta. Branching pattern of the brachiocephalic vessels is
normal without origin stenosis. Pulmonary arteries appear normal.

Mediastinum/Nodes: No mass or adenopathy.

Lungs/Pleura: No evidence of background emphysema. No mass or
nodule. No infiltrate or collapse. No effusion.

Upper Abdomen: Embolic material in the region of the upper abdominal
veins. Previous cholecystectomy. No acute finding.

Musculoskeletal: Ordinary thoracic degenerative changes.

Review of the MIP images confirms the above findings.
IMPRESSION: Dilatation of the ascending aorta with maximal transverse diameter
of 4.0 cm no dissection. Recommend annual imaging followup by CTA or
MRA. This recommendation follows 8313
ACCF/AHA/AATS/ACR/ASA/SCA/GARUDA/SOM/SERGEY/AUJLA Guidelines for the
Diagnosis and Management of Patients with Thoracic Aortic Disease.
Circulation. 8313; 121: [id number]. Aortic aneurysm NOS (MXZAZ-EFE.6)

Coronary artery calcification. Relatively mild scattered thoracic
atherosclerotic calcification.

## 2022-07-06 ENCOUNTER — Ambulatory Visit (INDEPENDENT_AMBULATORY_CARE_PROVIDER_SITE_OTHER): Payer: Medicare Other | Admitting: Podiatry

## 2022-07-06 DIAGNOSIS — E1142 Type 2 diabetes mellitus with diabetic polyneuropathy: Secondary | ICD-10-CM

## 2022-07-06 DIAGNOSIS — M792 Neuralgia and neuritis, unspecified: Secondary | ICD-10-CM

## 2022-07-06 DIAGNOSIS — M5417 Radiculopathy, lumbosacral region: Secondary | ICD-10-CM

## 2022-07-06 DIAGNOSIS — G90522 Complex regional pain syndrome I of left lower limb: Secondary | ICD-10-CM | POA: Diagnosis not present

## 2022-07-06 MED ORDER — COLCHICINE 0.6 MG PO TABS: 0.6000 mg | ORAL_TABLET | Freq: Every day | ORAL | 0 refills | Status: AC

## 2022-07-06 MED ORDER — PREGABALIN 50 MG PO CAPS: 50.0000 mg | ORAL_CAPSULE | Freq: Two times a day (BID) | ORAL | 1 refills | Status: AC

## 2022-07-06 NOTE — Progress Notes (Signed)
Subjective:  Patient ID: Dana Brown, female    DOB: 02/24/1935,  MRN: [id number]  Chief Complaint  Patient presents with   Peripheral Neuropathy    Left foot - neuropathy. Patient was taking gabapentin about 8 years ago. Patient is diabetic but does not take any medications.     88 y.o. female presents with concern for neuropathic pain in the left foot.  She does have a history of significant neuropathy related to diabetes mellitus.  She has previously tried taking gabapentin in the past but is not taking it anymore due to side effect.  She has never tried taking pregabalin.  She is going to see a pain specialist soon for this problem.  Was told that there may be some injections that can be done for problems in her lower back which could also be causing much of her nerve pain in the left lower extremity.  Patient also reports a history of gout and reports she had a recent flareup with redness and swelling in the great toe joint on the left foot.  She does not have any medication for gout flares.  Past Medical History:  Diagnosis Date   Acute on chronic combined systolic and diastolic CHF (congestive heart failure) ([place]) 07/10/2019   Swelling in lower extremeties Mild to Moderate Duration: several years   Congestive heart failure (CHF) ([place]) 07/10/2019   Diabetes mellitus due to underlying condition with unspecified complications ([place]) 10/01/6268   Diabetes mellitus with peripheral autonomic neuropathy ([place]) 07/10/2019   Mild to moderate. Diet controlled only.  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  Abnormal coloration to the left foot mild mottling and reddish purplish venous pigmentation  Vascular:  Dorsalis Pedis artery and Posterior Tibial artery pedal pulses are 1/4 bilateral.  Foot does feel slightly cold to touch on the left side.  Neruologic: Pain with palpation of the left foot and subjective sensation of burning and tingling pain.  Musculoskeletal: Pain with palpation of the left forefoot.  Gait: Unassisted, Nonantalgic.   No images are attached to the encounter.  Assessment:   1. Neuropathic pain   2. Complex regional pain syndrome type 1 of left lower extremity   3. DM type 2 with diabetic peripheral neuropathy (HCC)   4. Radiculopathy of lumbosacral region      Plan:  Patient was evaluated and treated and all questions answered.  # Neuropathic pain likely related to diabetic polyneuropathy possible CRPS and radiculopathy or sciatic of the lumbosacral region. -Discussed with the patient I would like her to try course of Lyrica 50 mg take twice daily for burning and tingling sensation and pain in the left lower extremity. -Discussed potential risks and side effects of this medication including drowsiness as well as altered mental status. -Patient would like to proceed is aware that it can have similar side effects to gabapentin which has previously caused her problems in the past.  She says gabapentin  is not effective at all for her so we will try this in the hopes that it may offer her some pain relief. -Do recommend she keeps her appointment with the pain specialist which is occurring next week hopefully they will be able to offer some further insight into what is causing this left lower extremity pain and may offer assistance with injections in the lumbosacral region.  # Chronic gout of the left foot though not  any acute attack at this time -Recommend treatment with colchicine 0.6 mg take once daily if having an acute gout flare -E- Rx for colchicine provided for 14 days but I want her only to take this if she experiences an acute gout flare.  Return in about 6 weeks (around 08/17/2022) for f/u neuropathy and gout.          Everitt Amber, DPM [facility]

## 2022-08-13 ENCOUNTER — Telehealth: Payer: Self-pay | Admitting: Cardiology

## 2022-08-13 NOTE — Telephone Encounter (Signed)
Recall to see Dr.Tobb - Spoke to daughter pt is with another provider. 03.15.2024 BM

## 2022-08-23 ENCOUNTER — Ambulatory Visit: Payer: Medicare Other | Admitting: Podiatry

## 2023-08-02 ENCOUNTER — Encounter: Payer: Self-pay | Admitting: Podiatry

## 2023-08-02 ENCOUNTER — Ambulatory Visit: Payer: Medicare Other | Admitting: Podiatry

## 2023-08-02 DIAGNOSIS — M79674 Pain in right toe(s): Secondary | ICD-10-CM

## 2023-08-02 DIAGNOSIS — E1142 Type 2 diabetes mellitus with diabetic polyneuropathy: Secondary | ICD-10-CM | POA: Diagnosis not present

## 2023-08-02 DIAGNOSIS — M79675 Pain in left toe(s): Secondary | ICD-10-CM | POA: Diagnosis not present

## 2023-08-02 DIAGNOSIS — B351 Tinea unguium: Secondary | ICD-10-CM

## 2023-08-02 NOTE — Progress Notes (Unsigned)
  Subjective:  Patient ID: Dana Brown, female    DOB: 1935-03-31,  MRN: [id number]  Chief Complaint  Patient presents with   [facility]    [facility] with out callous today.  Takes ASA 81 and Fish oil, Last A1c was in Nov. It was 6.4.  She also states she has neuropathy with the left foot being the worst.  Would like to know your opinion and treatment. Her feet are like ice.     89 y.o. female presents with the above complaint. History confirmed with patient. Patient presenting with pain related to dystrophic thickened elongated nails. Patient is unable to trim own nails related to nail dystrophy and/or mobility issues. Patient does have a history of T2DM.  She also complains of neuropathic pain.  She reports wanting to avoid oral neuropathy medication due to concern for balance.  She had previously tried oral gabapentin 8 years ago and reports some adverse effects.  Reports left side radiculopathy.  Objective:  Physical Exam: Warm to cool, capillary refill 3 to 5 seconds to the digits, pedal skin atrophic, pedal hair growth absent, telangiectasias and varicosities noted nail exam onychomycosis of the toenails, onycholysis, and dystrophic nails DP pulses faintly palpable, PT pulses nonpalpable, and vibratory sensation diminished, protective sensation diminished subjective burning and tingling sensations to toes, +1 pitting edema bilaterally Left Foot:  Pain with palpation of nails due to elongation and dystrophic growth.  Right Foot: Pain with palpation of nails due to elongation and dystrophic growth.   Assessment:   1. DM type 2 with diabetic peripheral neuropathy (HCC)   2. Pain due to onychomycosis of toenails of both feet      Plan:  Patient was evaluated and treated and all questions answered.  #Onychomycosis with pain  -Nails palliatively debrided as below. -Educated on self-care  Procedure: Nail Debridement Rationale: Pain Type of Debridement: manual, sharp  debridement. Instrumentation: Nail nipper, rotary burr. Number of Nails: 10  # Diabetic neuropathy -Patient reports adverse reaction with oral gabapentin in the past and is hesitant to try other oral medications due to some of her comorbidities -Sending in compound neuropathic cream to be applied topically -Patient potentially interested in Qutenza.  Return in about 4 weeks (around 08/30/2023) for Neuropathy.         Bronwen Betters, DPM [facility] / [facility]

## 2023-08-30 ENCOUNTER — Ambulatory Visit: Admitting: Podiatry
